# Patient Record
Sex: Female | Born: 1980 | Race: Black or African American | Hispanic: No | Marital: Single | Smoking: Current every day smoker
Health system: Southern US, Community
[De-identification: ages and names within clinical notes are randomized; demographics above are authoritative.]

---

## 2004-10-03 HISTORY — PX: CHOLECYSTECTOMY: SHX55

## 2005-04-11 ENCOUNTER — Emergency Department (HOSPITAL_COMMUNITY): Admission: EM | Admit: 2005-04-11 | Discharge: 2005-04-11 | Payer: Self-pay | Admitting: Emergency Medicine

## 2005-04-28 ENCOUNTER — Emergency Department (HOSPITAL_COMMUNITY): Admission: EM | Admit: 2005-04-28 | Discharge: 2005-04-28 | Payer: Self-pay | Admitting: Emergency Medicine

## 2005-05-14 ENCOUNTER — Inpatient Hospital Stay (HOSPITAL_COMMUNITY): Admission: EM | Admit: 2005-05-14 | Discharge: 2005-05-14 | Payer: Self-pay | Admitting: Emergency Medicine

## 2006-01-02 IMAGING — RF DG CHOLANGIOGRAM OPERATIVE
1 series · 8 of 8 positions shown · non-contrast
Comparison: none

CLINICAL DATA: 23-year-old with acute cholecystitis. 
INTRAOPERATIVE CHOLANGIOGRAM:
Intraoperative cholangiogram was performed by Dr. Rosaynie. 
Two runs are submitted.  These demonstrates contrast within a cystic duct remnant, common hepatic, and common bile duct.  The ducts appear to be normal in caliber.  No filling defects are identified.  There is flow into the duodenum.  There is little opacification of the intrahepatic ducts.

[Series 1: run · 2 acquisitions, 8 frames shown]
[im 1/2]
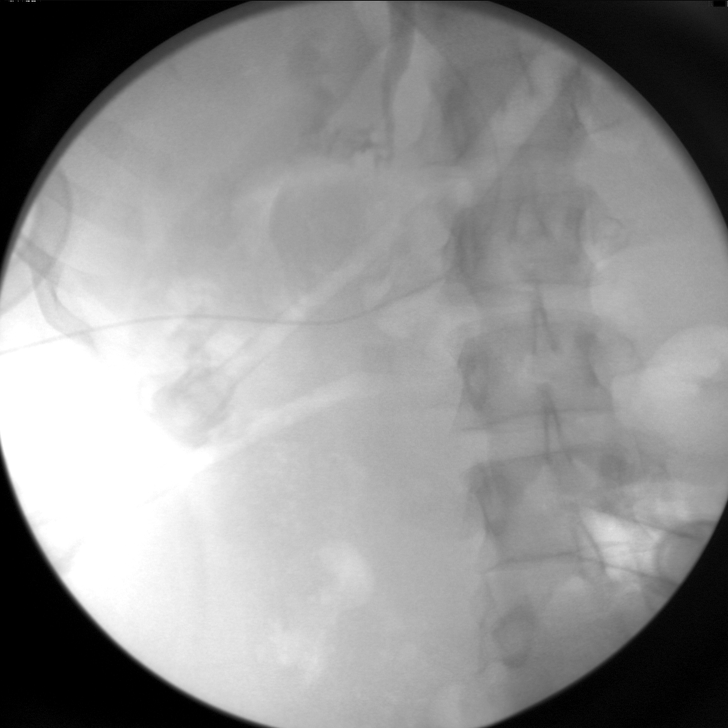
[im 1/2]
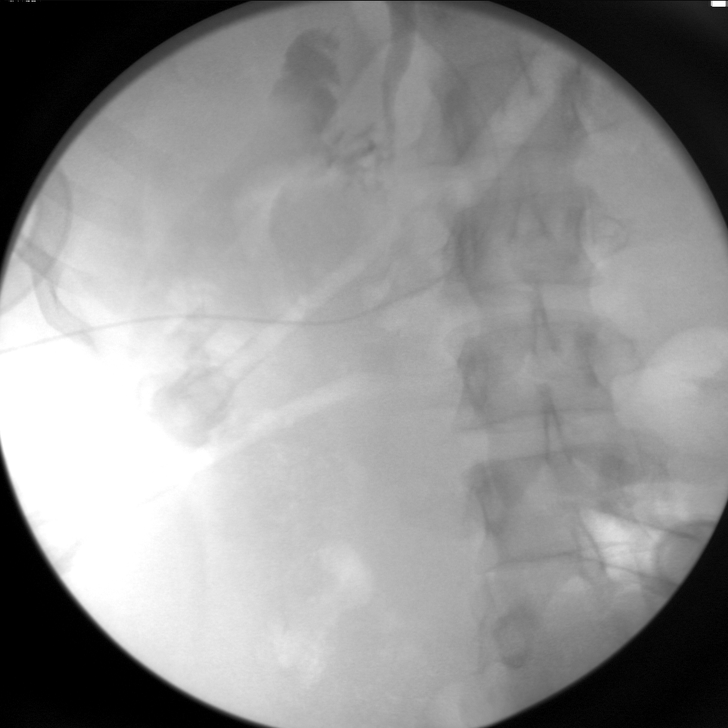
[im 1/2]
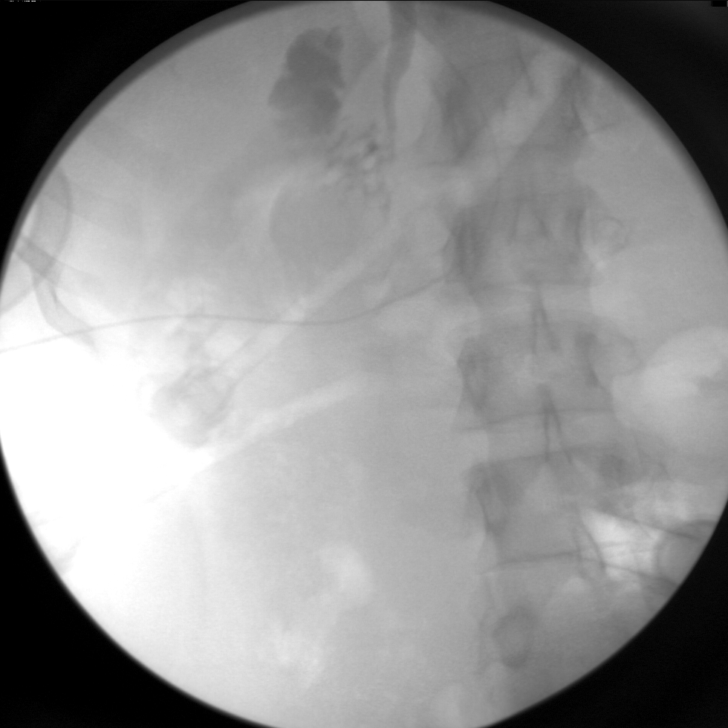
[im 1/2]
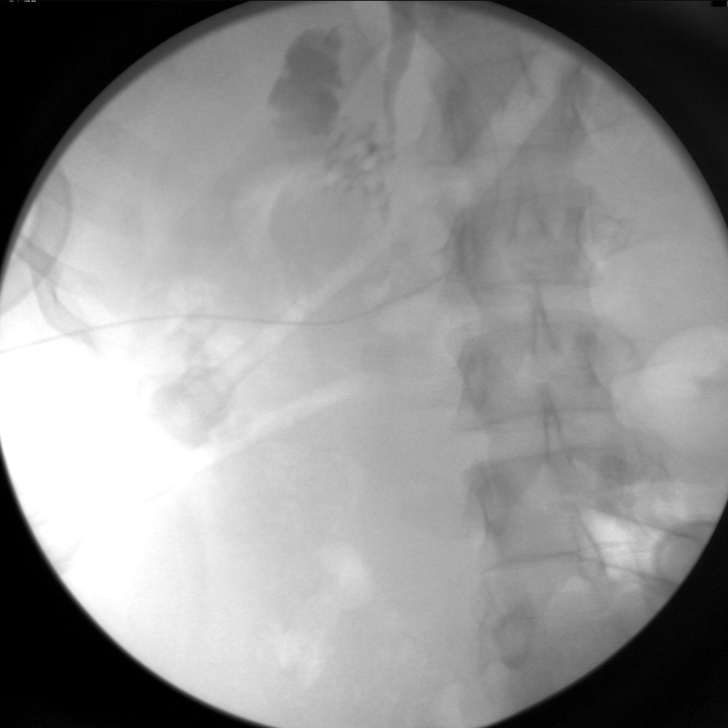
[im 2/2]
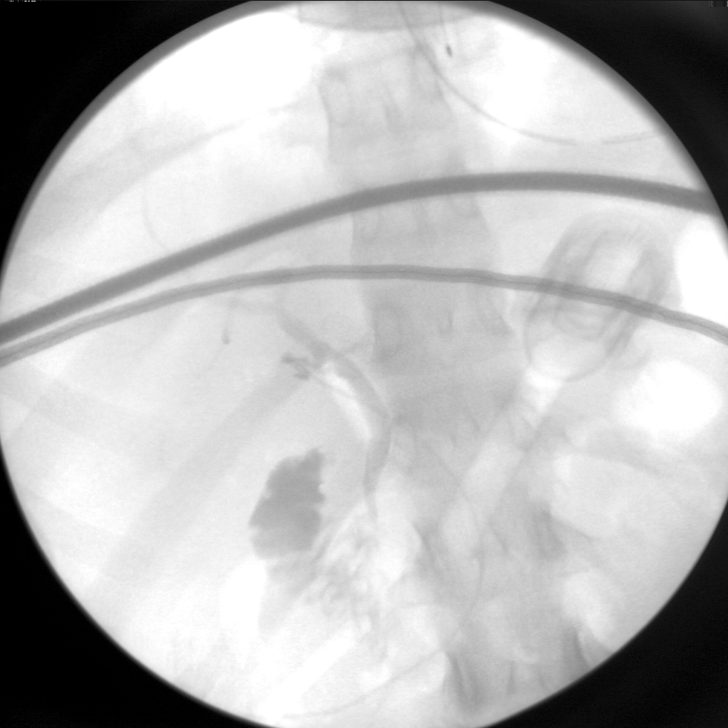
[im 2/2]
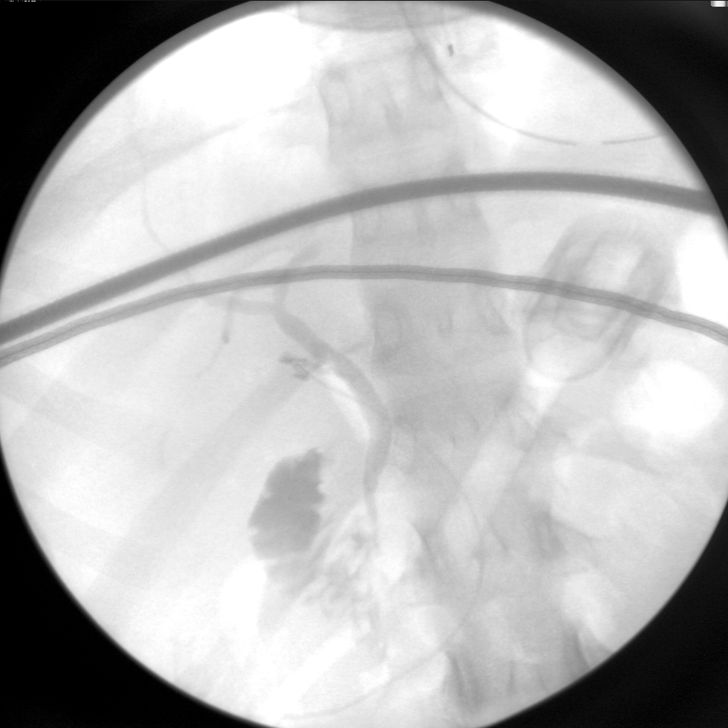
[im 2/2]
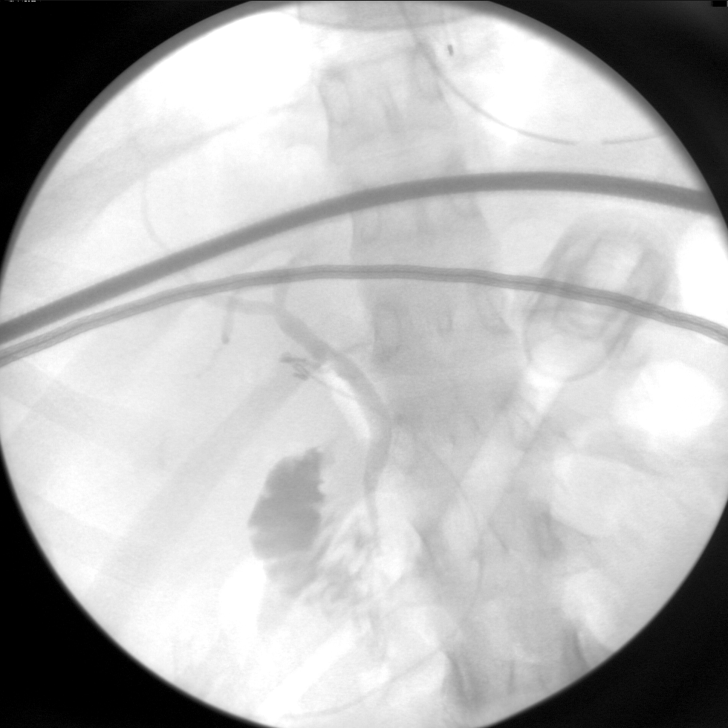
[im 2/2]
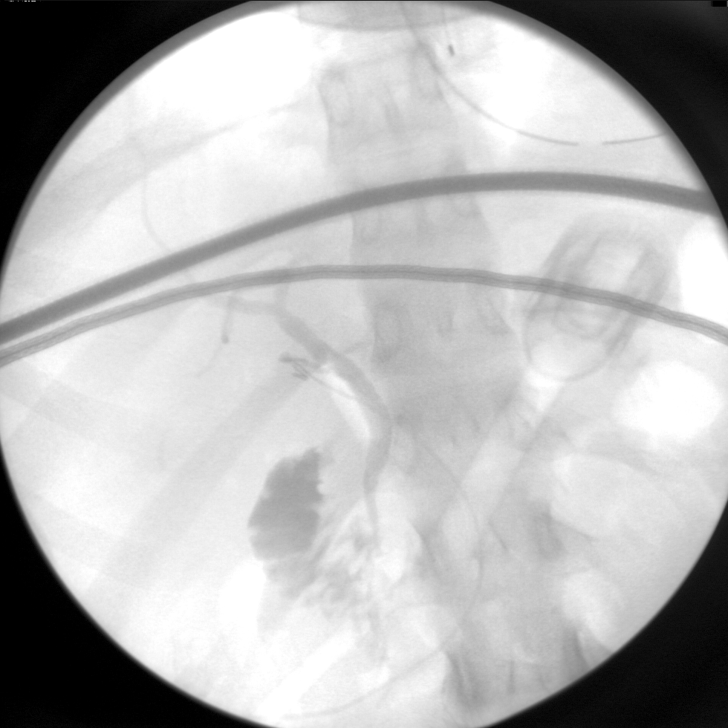

[8 of 8 positions shown; findings below may reference images not displayed]

IMPRESSION: No evidence for retained stones or stricture.

## 2007-10-04 HISTORY — PX: ECTOPIC PREGNANCY SURGERY: SHX613

## 2011-02-10 ENCOUNTER — Other Ambulatory Visit (HOSPITAL_COMMUNITY): Payer: Self-pay | Admitting: Obstetrics and Gynecology

## 2011-02-10 DIAGNOSIS — Z3689 Encounter for other specified antenatal screening: Secondary | ICD-10-CM

## 2011-02-15 ENCOUNTER — Ambulatory Visit (HOSPITAL_COMMUNITY)
Admission: RE | Admit: 2011-02-15 | Discharge: 2011-02-15 | Disposition: A | Discharge status: Home / Self Care | Payer: Medicaid Other | Source: Ambulatory Visit | Attending: Obstetrics and Gynecology | Admitting: Obstetrics and Gynecology

## 2011-02-15 DIAGNOSIS — E669 Obesity, unspecified: Secondary | ICD-10-CM | POA: Insufficient documentation

## 2011-02-15 DIAGNOSIS — O99330 Smoking (tobacco) complicating pregnancy, unspecified trimester: Secondary | ICD-10-CM | POA: Insufficient documentation

## 2011-02-15 DIAGNOSIS — O358XX0 Maternal care for other (suspected) fetal abnormality and damage, not applicable or unspecified: Secondary | ICD-10-CM | POA: Insufficient documentation

## 2011-02-15 DIAGNOSIS — Z363 Encounter for antenatal screening for malformations: Secondary | ICD-10-CM | POA: Insufficient documentation

## 2011-02-15 DIAGNOSIS — O99810 Abnormal glucose complicating pregnancy: Secondary | ICD-10-CM | POA: Insufficient documentation

## 2011-02-15 DIAGNOSIS — Z1389 Encounter for screening for other disorder: Secondary | ICD-10-CM | POA: Insufficient documentation

## 2011-02-15 DIAGNOSIS — Z3689 Encounter for other specified antenatal screening: Secondary | ICD-10-CM

## 2011-04-21 ENCOUNTER — Encounter (HOSPITAL_COMMUNITY)
Admission: RE | Admit: 2011-04-21 | Discharge: 2011-04-21 | Disposition: A | Discharge status: Home / Self Care | Payer: Medicaid Other | Source: Ambulatory Visit | Attending: Obstetrics & Gynecology | Admitting: Obstetrics & Gynecology

## 2011-04-21 ENCOUNTER — Encounter (HOSPITAL_COMMUNITY): Payer: Self-pay

## 2011-04-21 LAB — CBC
HCT: 38.4 % (ref 36.0–46.0)
Platelets: 255 10*3/uL (ref 150–400)
RDW: 15.4 % (ref 11.5–15.5)
WBC: 9 10*3/uL (ref 4.0–10.5)

## 2011-04-21 LAB — SURGICAL PCR SCREEN
MRSA, PCR: NEGATIVE
Staphylococcus aureus: NEGATIVE

## 2011-04-21 LAB — RPR: RPR Ser Ql: NONREACTIVE

## 2011-04-21 NOTE — Patient Instructions (Addendum)
20 [name]  04/21/2011   Your procedure is scheduled on:  Monday 7/23  Report to [facility] at 1045 AM.  Call this number if you have problems the morning of surgery: [phone]   Remember:   Do not eat food:After Midnight.  Do not drink clear liquids: After Midnight.  Take these medicines the morning of surgery with A SIP OF WATER: none                Take 1/2 of usual bedtime dose of insulin the night before surgery.  Do NOT take any insulin the morning of surgery.   Do not wear jewelry, make-up or nail polish.  Do not bring valuables to the hospital.  Contacts, dentures or bridgework may not be worn into surgery.  Leave suitcase in the car. After surgery it may be brought to your room.  For patients admitted to the hospital, checkout time is 11:00 AM the day of discharge.   Patients discharged the day of surgery will not be allowed to drive home.  Name and phone number of your driver: Velna Hatchet - mother - [phone]   Special Instructions: morning of surgery - go to front desk, call [phone] tell them your name and you are here for surgery

## 2011-04-22 ENCOUNTER — Encounter (HOSPITAL_COMMUNITY): Payer: Self-pay | Admitting: *Deleted

## 2011-04-22 ENCOUNTER — Other Ambulatory Visit (HOSPITAL_COMMUNITY): Payer: Medicaid Other

## 2011-04-24 ENCOUNTER — Other Ambulatory Visit: Payer: Self-pay | Admitting: Obstetrics & Gynecology

## 2011-04-24 DIAGNOSIS — Z98891 History of uterine scar from previous surgery: Secondary | ICD-10-CM | POA: Insufficient documentation

## 2011-04-24 DIAGNOSIS — O24414 Gestational diabetes mellitus in pregnancy, insulin controlled: Secondary | ICD-10-CM

## 2011-04-24 NOTE — Progress Notes (Signed)
  29 y.o. [id number]  EDD 7/28, admitted for repeat C/S and tubal sterilization . 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 Prenatal labs: Blood Type:O+; HIV and RPR non-reactive; Hep B neg; Rubella Immune; GBS neg; Fetal screens negative.    Afebrile, VSS Heart and Lungs: No active disease Abdomen: soft, gravid, EFW AGA.  Impression: 39+ wk. pregnancy complicated by previous C/S and insulin dependent GDM.  Pt. desires tubal sterilization.  Plan:  Repeat cesarean, tubal sterilization

## 2011-04-25 ENCOUNTER — Encounter (HOSPITAL_COMMUNITY): Payer: Self-pay | Admitting: Anesthesiology

## 2011-04-25 ENCOUNTER — Inpatient Hospital Stay (HOSPITAL_COMMUNITY)
Admission: RE | Admit: 2011-04-25 | Discharge: 2011-04-27 | DRG: 766 | Disposition: A | Discharge status: Home / Self Care | Payer: Medicaid Other | Source: Ambulatory Visit | Attending: Obstetrics & Gynecology | Admitting: Obstetrics & Gynecology

## 2011-04-25 ENCOUNTER — Inpatient Hospital Stay (HOSPITAL_COMMUNITY): Payer: Medicaid Other | Admitting: Anesthesiology

## 2011-04-25 ENCOUNTER — Encounter (HOSPITAL_COMMUNITY)
Admission: RE | Disposition: A | Discharge status: Home / Self Care | Payer: Self-pay | Source: Ambulatory Visit | Attending: Obstetrics & Gynecology

## 2011-04-25 DIAGNOSIS — Z01812 Encounter for preprocedural laboratory examination: Secondary | ICD-10-CM

## 2011-04-25 DIAGNOSIS — Z302 Encounter for sterilization: Secondary | ICD-10-CM

## 2011-04-25 DIAGNOSIS — O99814 Abnormal glucose complicating childbirth: Secondary | ICD-10-CM | POA: Diagnosis present

## 2011-04-25 DIAGNOSIS — O24414 Gestational diabetes mellitus in pregnancy, insulin controlled: Secondary | ICD-10-CM

## 2011-04-25 DIAGNOSIS — O34219 Maternal care for unspecified type scar from previous cesarean delivery: Principal | ICD-10-CM | POA: Diagnosis present

## 2011-04-25 DIAGNOSIS — Z01818 Encounter for other preprocedural examination: Secondary | ICD-10-CM

## 2011-04-25 DIAGNOSIS — Z98891 History of uterine scar from previous surgery: Secondary | ICD-10-CM

## 2011-04-25 LAB — GLUCOSE, CAPILLARY
Glucose-Capillary: 80 mg/dL (ref 70–99)
Glucose-Capillary: 71 mg/dL (ref 70–99)

## 2011-04-25 LAB — ABO/RH: ABO/RH(D): O POS

## 2011-04-25 SURGERY — Surgical Case
Anesthesia: Spinal | Site: Abdomen | Wound class: Clean Contaminated

## 2011-04-25 MED ORDER — BUPIVACAINE IN DEXTROSE 0.75-8.25 % IT SOLN
INTRATHECAL | Status: DC | PRN
  Administered 2011-04-25: 13.5 mg via INTRATHECAL

## 2011-04-25 MED ORDER — LACTATED RINGERS IV SOLN
INTRAVENOUS | Status: DC | PRN
  Administered 2011-04-25 (×3): via INTRAVENOUS

## 2011-04-25 MED ORDER — SIMETHICONE 80 MG PO CHEW
80.0000 mg | CHEWABLE_TABLET | Freq: Three times a day (TID) | ORAL | Status: DC
  Administered 2011-04-25 – 2011-04-27 (×5): 80 mg via ORAL

## 2011-04-25 MED ORDER — KETOROLAC TROMETHAMINE 30 MG/ML IJ SOLN
30.0000 mg | Freq: Four times a day (QID) | INTRAMUSCULAR | Status: AC | PRN
  Filled 2011-04-25: qty 1

## 2011-04-25 MED ORDER — MEPERIDINE HCL 25 MG/ML IJ SOLN: 6.2500 mg | INTRAMUSCULAR | Status: DC | PRN

## 2011-04-25 MED ORDER — MENTHOL 3 MG MT LOZG: 1.0000 | LOZENGE | OROMUCOSAL | Status: DC | PRN

## 2011-04-25 MED ORDER — SIMETHICONE 80 MG PO CHEW: 80.0000 mg | CHEWABLE_TABLET | ORAL | Status: DC | PRN

## 2011-04-25 MED ORDER — SENNOSIDES-DOCUSATE SODIUM 8.6-50 MG PO TABS
1.0000 | ORAL_TABLET | Freq: Every day | ORAL | Status: DC
  Administered 2011-04-25: 2 via ORAL
  Administered 2011-04-26: 1 via ORAL

## 2011-04-25 MED ORDER — ONDANSETRON HCL 4 MG/2ML IJ SOLN: 4.0000 mg | Freq: Three times a day (TID) | INTRAMUSCULAR | Status: DC | PRN

## 2011-04-25 MED ORDER — METOCLOPRAMIDE HCL 5 MG/ML IJ SOLN
10.0000 mg | Freq: Three times a day (TID) | INTRAMUSCULAR | Status: DC | PRN
  Administered 2011-04-25: 10 mg via INTRAVENOUS

## 2011-04-25 MED ORDER — TETANUS-DIPHTH-ACELL PERTUSSIS 5-2.5-18.5 LF-MCG/0.5 IM SUSP
0.5000 mL | Freq: Once | INTRAMUSCULAR | Status: AC
  Administered 2011-04-26: 0.5 mL via INTRAMUSCULAR
  Filled 2011-04-25: qty 0.5

## 2011-04-25 MED ORDER — LACTATED RINGERS IV SOLN
INTRAVENOUS | Status: DC
  Administered 2011-04-25 (×2): via INTRAVENOUS

## 2011-04-25 MED ORDER — LACTATED RINGERS IV SOLN
INTRAVENOUS | Status: DC
  Administered 2011-04-25: 18:00:00 via INTRAVENOUS

## 2011-04-25 MED ORDER — OXYTOCIN 20 UNITS IN LACTATED RINGERS INFUSION - SIMPLE: 125.0000 mL/h | INTRAVENOUS | Status: DC

## 2011-04-25 MED ORDER — DIPHENHYDRAMINE HCL 25 MG PO CAPS
25.0000 mg | ORAL_CAPSULE | ORAL | Status: DC | PRN
  Administered 2011-04-25 – 2011-04-26 (×4): 25 mg via ORAL
  Filled 2011-04-25 (×3): qty 1

## 2011-04-25 MED ORDER — DIPHENHYDRAMINE HCL 50 MG/ML IJ SOLN
INTRAMUSCULAR | Status: AC
  Administered 2011-04-25: 25 mg via INTRAMUSCULAR
  Filled 2011-04-25: qty 1

## 2011-04-25 MED ORDER — METOCLOPRAMIDE HCL 5 MG/ML IJ SOLN
INTRAMUSCULAR | Status: AC
  Administered 2011-04-25: 10 mg via INTRAVENOUS
  Filled 2011-04-25: qty 2

## 2011-04-25 MED ORDER — CEFAZOLIN SODIUM-DEXTROSE 2-3 GM-% IV SOLR
2.0000 g | INTRAVENOUS | Status: AC
  Administered 2011-04-25: 2 g via INTRAVENOUS
  Filled 2011-04-25: qty 50

## 2011-04-25 MED ORDER — KETOROLAC TROMETHAMINE 60 MG/2ML IM SOLN
INTRAMUSCULAR | Status: AC
  Administered 2011-04-25: 60 mg via INTRAMUSCULAR
  Filled 2011-04-25: qty 2

## 2011-04-25 MED ORDER — KETOROLAC TROMETHAMINE 60 MG/2ML IM SOLN
60.0000 mg | Freq: Once | INTRAMUSCULAR | Status: AC | PRN
  Administered 2011-04-25: 60 mg via INTRAMUSCULAR

## 2011-04-25 MED ORDER — MORPHINE SULFATE (PF) 0.5 MG/ML IJ SOLN
INTRAMUSCULAR | Status: DC | PRN
  Administered 2011-04-25: .15 mg via INTRATHECAL

## 2011-04-25 MED ORDER — SCOPOLAMINE 1 MG/3DAYS TD PT72
MEDICATED_PATCH | TRANSDERMAL | Status: AC
  Administered 2011-04-25: 1.5 mg
  Filled 2011-04-25: qty 1

## 2011-04-25 MED ORDER — FENTANYL CITRATE 0.05 MG/ML IJ SOLN
INTRAMUSCULAR | Status: DC | PRN
  Administered 2011-04-25: 20 ug via INTRATHECAL

## 2011-04-25 MED ORDER — OXYCODONE-ACETAMINOPHEN 5-325 MG PO TABS
1.0000 | ORAL_TABLET | ORAL | Status: DC | PRN
  Administered 2011-04-26 (×2): 1 via ORAL
  Administered 2011-04-26 (×3): 2 via ORAL
  Administered 2011-04-27 (×2): 1 via ORAL
  Filled 2011-04-25: qty 1
  Filled 2011-04-25: qty 2
  Filled 2011-04-25 (×4): qty 1
  Filled 2011-04-25: qty 2
  Filled 2011-04-25: qty 1

## 2011-04-25 MED ORDER — OXYTOCIN 20 UNITS IN LACTATED RINGERS INFUSION - SIMPLE
INTRAVENOUS | Status: DC | PRN
  Administered 2011-04-25 (×2): 20 [IU] via INTRAVENOUS

## 2011-04-25 MED ORDER — NALBUPHINE SYRINGE 5 MG/0.5 ML
5.0000 mg | INJECTION | INTRAMUSCULAR | Status: AC | PRN
  Filled 2011-04-25: qty 1

## 2011-04-25 MED ORDER — EPHEDRINE SULFATE 50 MG/ML IJ SOLN
INTRAMUSCULAR | Status: DC | PRN
  Administered 2011-04-25 (×2): 10 mg via INTRAVENOUS

## 2011-04-25 MED ORDER — DIPHENHYDRAMINE HCL 50 MG/ML IJ SOLN: 12.5000 mg | INTRAMUSCULAR | Status: DC | PRN

## 2011-04-25 MED ORDER — ONDANSETRON HCL 4 MG/2ML IJ SOLN
INTRAMUSCULAR | Status: AC
  Filled 2011-04-25: qty 2

## 2011-04-25 MED ORDER — SODIUM CHLORIDE 0.9 % IR SOLN
Status: DC | PRN
  Administered 2011-04-25: 1000 mL

## 2011-04-25 MED ORDER — KETOROLAC TROMETHAMINE 30 MG/ML IJ SOLN
30.0000 mg | Freq: Four times a day (QID) | INTRAMUSCULAR | Status: AC | PRN
  Administered 2011-04-25: 30 mg via INTRAVENOUS

## 2011-04-25 MED ORDER — IBUPROFEN 600 MG PO TABS
600.0000 mg | ORAL_TABLET | Freq: Four times a day (QID) | ORAL | Status: DC | PRN
  Administered 2011-04-26 – 2011-04-27 (×2): 600 mg via ORAL
  Filled 2011-04-25 (×2): qty 1

## 2011-04-25 MED ORDER — ZOLPIDEM TARTRATE 5 MG PO TABS: 5.0000 mg | ORAL_TABLET | Freq: Every evening | ORAL | Status: DC | PRN

## 2011-04-25 MED ORDER — PRENATAL PLUS 27-1 MG PO TABS
1.0000 | ORAL_TABLET | Freq: Every day | ORAL | Status: DC
  Administered 2011-04-26 – 2011-04-27 (×2): 1 via ORAL
  Filled 2011-04-25 (×2): qty 1

## 2011-04-25 MED ORDER — NALOXONE HCL 0.4 MG/ML IJ SOLN: 0.4000 mg | INTRAMUSCULAR | Status: DC | PRN

## 2011-04-25 MED ORDER — PHENYLEPHRINE 40 MCG/ML (10ML) SYRINGE FOR IV PUSH (FOR BLOOD PRESSURE SUPPORT)
PREFILLED_SYRINGE | INTRAVENOUS | Status: AC
  Filled 2011-04-25: qty 5

## 2011-04-25 MED ORDER — DIPHENHYDRAMINE HCL 50 MG/ML IJ SOLN
25.0000 mg | INTRAMUSCULAR | Status: DC | PRN
Start: 2011-04-25 — End: 2011-04-27
  Administered 2011-04-25: 25 mg via INTRAMUSCULAR

## 2011-04-25 MED ORDER — OXYTOCIN 10 UNIT/ML IJ SOLN
INTRAMUSCULAR | Status: AC
  Filled 2011-04-25: qty 4

## 2011-04-25 MED ORDER — MORPHINE SULFATE 0.5 MG/ML IJ SOLN
INTRAMUSCULAR | Status: AC
  Filled 2011-04-25: qty 10

## 2011-04-25 MED ORDER — IBUPROFEN 600 MG PO TABS
600.0000 mg | ORAL_TABLET | Freq: Four times a day (QID) | ORAL | Status: DC
  Administered 2011-04-26 – 2011-04-27 (×5): 600 mg via ORAL
  Filled 2011-04-25 (×5): qty 1

## 2011-04-25 MED ORDER — SODIUM CHLORIDE 0.9 % IJ SOLN: 3.0000 mL | INTRAMUSCULAR | Status: DC | PRN

## 2011-04-25 MED ORDER — PHENYLEPHRINE HCL 10 MG/ML IJ SOLN
INTRAMUSCULAR | Status: DC | PRN
  Administered 2011-04-25 (×2): 80 ug via INTRAVENOUS

## 2011-04-25 MED ORDER — ONDANSETRON HCL 4 MG/2ML IJ SOLN: 4.0000 mg | INTRAMUSCULAR | Status: DC | PRN

## 2011-04-25 MED ORDER — WITCH HAZEL-GLYCERIN EX PADS: MEDICATED_PAD | CUTANEOUS | Status: DC | PRN

## 2011-04-25 MED ORDER — EPHEDRINE 5 MG/ML INJ
INTRAVENOUS | Status: AC
  Filled 2011-04-25: qty 10

## 2011-04-25 MED ORDER — DIPHENHYDRAMINE HCL 25 MG PO CAPS
25.0000 mg | ORAL_CAPSULE | Freq: Four times a day (QID) | ORAL | Status: DC | PRN
  Administered 2011-04-26: 25 mg via ORAL
  Filled 2011-04-25 (×3): qty 1

## 2011-04-25 MED ORDER — FENTANYL CITRATE 0.05 MG/ML IJ SOLN
INTRAMUSCULAR | Status: AC
  Filled 2011-04-25: qty 2

## 2011-04-25 MED ORDER — SCOPOLAMINE 1 MG/3DAYS TD PT72: 1.0000 | MEDICATED_PATCH | Freq: Once | TRANSDERMAL | Status: DC

## 2011-04-25 MED ORDER — SODIUM CHLORIDE 0.9 % IV SOLN
1.0000 ug/kg/h | INTRAVENOUS | Status: DC | PRN
  Filled 2011-04-25: qty 2.5

## 2011-04-25 MED ORDER — ONDANSETRON HCL 4 MG PO TABS: 4.0000 mg | ORAL_TABLET | ORAL | Status: DC | PRN

## 2011-04-25 MED ORDER — ONDANSETRON HCL 4 MG/2ML IJ SOLN
INTRAMUSCULAR | Status: DC | PRN
  Administered 2011-04-25: 4 mg via INTRAVENOUS

## 2011-04-25 SURGICAL SUPPLY — 32 items
CLIP FILSHIE TUBAL LIGA STRL (Clip) ×1 IMPLANT
CLOTH BEACON ORANGE TIMEOUT ST (SAFETY) ×2 IMPLANT
CONTAINER PREFILL 10% NBF 15ML (MISCELLANEOUS) IMPLANT
DRAPE UTILITY XL STRL (DRAPES) ×2 IMPLANT
ELECT REM PT RETURN 9FT ADLT (ELECTROSURGICAL) ×2
ELECTRODE REM PT RTRN 9FT ADLT (ELECTROSURGICAL) ×1 IMPLANT
EXTRACTOR VACUUM M CUP 4 TUBE (SUCTIONS) ×1 IMPLANT
GLOVE ECLIPSE 6.0 STRL STRAW (GLOVE) ×2 IMPLANT
GLOVE ECLIPSE 6.5 STRL STRAW (GLOVE) ×2 IMPLANT
GOWN BRE IMP SLV AUR LG STRL (GOWN DISPOSABLE) ×6 IMPLANT
KIT ABG SYR 3ML LUER SLIP (SYRINGE) IMPLANT
NDL HYPO 25X5/8 SAFETYGLIDE (NEEDLE) ×1 IMPLANT
NEEDLE HYPO 25X5/8 SAFETYGLIDE (NEEDLE) ×2 IMPLANT
NS IRRIG 1000ML POUR BTL (IV SOLUTION) ×2 IMPLANT
PACK C SECTION WH (CUSTOM PROCEDURE TRAY) ×2 IMPLANT
RTRCTR C-SECT PINK 25CM LRG (MISCELLANEOUS) ×1 IMPLANT
SLEEVE SCD COMPRESS KNEE MED (MISCELLANEOUS) IMPLANT
STAPLER VISISTAT 35W (STAPLE) ×1 IMPLANT
SUT PLAIN 0 NONE (SUTURE) ×1 IMPLANT
SUT VIC AB 0 CT1 27 (SUTURE) ×6
SUT VIC AB 0 CT1 27XBRD ANBCTR (SUTURE) ×3 IMPLANT
SUT VIC AB 1 CTX 36 (SUTURE) ×4
SUT VIC AB 1 CTX36XBRD ANBCTRL (SUTURE) ×2 IMPLANT
SUT VIC AB 3-0 CT1 27 (SUTURE) ×2
SUT VIC AB 3-0 CT1 TAPERPNT 27 (SUTURE) ×1 IMPLANT
SUT VIC AB 3-0 PS2 18 (SUTURE)
SUT VIC AB 3-0 PS2 18XBRD (SUTURE) IMPLANT
SUT VIC AB 3-0 SH 27 (SUTURE) ×2
SUT VIC AB 3-0 SH 27X BRD (SUTURE) IMPLANT
TOWEL OR 17X24 6PK STRL BLUE (TOWEL DISPOSABLE) ×4 IMPLANT
TRAY FOLEY CATH 14FR (SET/KITS/TRAYS/PACK) ×2 IMPLANT
WATER STERILE IRR 1000ML POUR (IV SOLUTION) ×2 IMPLANT

## 2011-04-25 NOTE — Progress Notes (Signed)
  Patient Name: TJUANA VICKREY MRN: [id number]  Date of Surgery: 04/25/2011    PREOPERATIVE DIAGNOSIS: prev cesarean section ;desire sterilization; insulin dependent GDM  POSTOPERATIVE DIAGNOSIS: prev cesarean section ;desire sterilization; insulin dependent GDM   PROCEDURE: Repeat C/S with tubal sterilization  SURGEON: Caralyn Guile. Arlyce Dice M.D.  ASSISTANT: Lodema Hong, M.D.  ANESTHESIA: Spinal  ESTIMATED BLOOD LOSS: 800 ml  FINDINGS: Female, 6 lbs. 8 ozs., Apgars 9/9, clear fluid, nl. tubes and ovaries   INDICATIONS: This is a 30 y.o.  [place] [address] who is admitted for prev c/s;desire sterilization.  PROCEDURE IN DETAIL: The patient was taken to the operating room and spinal anesthesia was placed.  She was then placed in the supine position with left lateral displacement of the uterus. The abdomen was prepped and draped in a sterile fashion and the bladder was catheterized.  A low transverse abdominal incision was made and carried down to the fascia. The fascia was opened transversely and the rectus sheath was dissected from the underlying rectus muscle. The rectus midline was identified and opened by sharp and blunt dissection. The peritoneum was opened. Omental adhesions were lysed.  An Alexis retractor was placed and the lower segment was identified. An transverse incision was made in the lower segment, carried down to the amnion, and extended bluntly.  The infant was delivered without difficulty with the aid of a vacuum extractor. The placenta was delivered with uterine message and cord traction. The uterus was bluntly curettage. The lower segment was closed with running interlocking Vicryl 1 suture.  The fallopian tubes were identified bilaterally, elevated with Babcock clamps and occluded at the isthmic-ampulary junction with Filshie clips.  The peritoneum and rectus muscle were closed in the midline with running 3-0 Vicryl suture. The fascia was closed with running 0 Vicryl suture and  the skin was closed with staples. All sponge and instrument counts were correct.  The patient tolerated the procedure well and left the operating room in good condition.

## 2011-04-25 NOTE — Anesthesia Postprocedure Evaluation (Signed)
  Anesthesia Post-op Note  Patient: Meghan Hughes  Procedure(s) Performed:  CESAREAN SECTION WITH BILATERAL TUBAL LIGATION - with Filshie Clips No anesthesia complications.  Level of consciousness: alert. Cardiopulmonary status stable.  No follow-up care or observation required.  Paquita Printy L. Rodman Pickle, MD

## 2011-04-25 NOTE — Anesthesia Preprocedure Evaluation (Addendum)
Anesthesia Evaluation  Name, MR# and DOB Patient awake  General Assessment Comment  Reviewed: Allergy & Precautions, H&P  and Patient's Chart, lab work & pertinent test results  Airway Mallampati: II TM Distance: >3 FB Neck ROM: full    Dental No notable dental hx    Pulmonary  clear to auscultation  pulmonary exam normal   Cardiovascular Exercise Tolerance: Good regular Normal   Neuro/Psych  GI/Hepatic/Renal   Endo/Other   (+) Diabetes mellitus-, Gestational Morbid obesity Abdominal   Musculoskeletal  Hematology   Peds  Reproductive/Obstetrics   Anesthesia Other Findings             Anesthesia Physical Anesthesia Plan  ASA: III  Anesthesia Plan: Spinal   Post-op Pain Management:    Induction:   Airway Management Planned:   Additional Equipment:   Intra-op Plan:   Post-operative Plan:   Informed Consent: I have reviewed the patients History and Physical, chart, labs and discussed the procedure including the risks, benefits and alternatives for the proposed anesthesia with the patient or authorized representative who has indicated his/her understanding and acceptance.   Dental Advisory Given  Plan Discussed with: CRNA  Anesthesia Plan Comments: (I discussed spinal anesthetic, and patient consents to the procedure:  included risk of possible headache,backache, failed block, allergic reaction, and nerve injury. This patient was asked if she had any questions or concerns before the procedure started. )        Anesthesia Quick Evaluation

## 2011-04-25 NOTE — Progress Notes (Signed)
UR chart review completed.  

## 2011-04-25 NOTE — H&P (Signed)
  29 y.o. G3P1011  EDD 7/28, admitted for repeat C/S and tubal sterilization . 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 Prenatal labs: Blood Type:O+; HIV and RPR non-reactive; Hep B neg; Rubella Immune; GBS neg; Fetal screens negative.    Afebrile, VSS Heart and Lungs: No active disease Abdomen: soft, gravid, EFW AGA.  Impression: 39+ wk. pregnancy complicated by previous C/S and insulin dependent GDM.  Pt. desires tubal sterilization.  Plan:  Repeat cesarean, tubal sterilization    

## 2011-04-25 NOTE — Consult Note (Signed)
Delivery Note   04/25/2011  1:03 PM  Requested by Dr.  Arlyce Dice  to attend this repeat C-section.    Born to a 30 y/o G3P1 mother with PNC O+Ab- and negative screens.    Prenatal problems included  GDM- Insulin controlled, obesity and  (+) smoker.       AROM at delivery with clear fluid.    Infant delivered via vacuum - assisted C-section.   Handed to Neo crying.  Dried, bulb suctioned and kept warm.  APGAR 9 and 9.  Shown to parents and transferred to CN.  Care transfer to Peds. Teaching service.    Meghan Abrahams V.T. [name], MD Neonatologist

## 2011-04-25 NOTE — Transfer of Care (Signed)
Immediate Anesthesia Transfer of Care Note  Patient: Meghan Hughes  Procedure(s) Performed:  CESAREAN SECTION WITH BILATERAL TUBAL LIGATION - with Filshie Clips   Patient Location: PACU  Anesthesia Type: Spinal  Level of Consciousness: awake, alert , oriented and patient cooperative  Airway & Oxygen Therapy: Patient Spontanous Breathing  Post-op Assessment: Report given to PACU RN and Post -op Vital signs reviewed and stable  Post vital signs: Reviewed and stable  Complications: No apparent anesthesia complications

## 2011-04-25 NOTE — Anesthesia Procedure Notes (Addendum)
Spinal Block  Patient location during procedure: OR Start time: 04/25/2011 12:46 PM Staffing Anesthesiologist: Jiles Garter Preanesthetic Checklist Completed: patient identified, site marked, surgical consent, pre-op evaluation, timeout performed, IV checked, risks and benefits discussed and monitors and equipment checked Spinal Block Patient position: sitting Prep: DuraPrep Patient monitoring: heart rate, cardiac monitor, continuous pulse ox and blood pressure Approach: midline Location: L3-4 Injection technique: single-shot Needle Needle type: Sprotte  Needle gauge: 24 G Needle length: 9 cm Assessment Sensory level: T4 Additional Notes Spinal Dosage in OR  Bupivicaine ml       1.8 PFMS04   mcg        150 Fentanyl mcg            20

## 2011-04-25 NOTE — Progress Notes (Deleted)
  30 y.o. G3P1011  EDD 7/28, admitted for repeat C/S and tubal sterilization . 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 Prenatal labs: Blood Type:O+; HIV and RPR non-reactive; Hep B neg; Rubella Immune; GBS neg; Fetal screens negative.    Afebrile, VSS Heart and Lungs: No active disease Abdomen: soft, gravid, EFW AGA.  Impression: 39+ wk. pregnancy complicated by previous C/S and insulin dependent GDM.  Pt. desires tubal sterilization.  Plan:  Repeat cesarean, tubal sterilization    

## 2011-04-25 NOTE — Brief Op Note (Signed)
04/25/2011  3:46 PM  PATIENT:  Oswaldo Milian  30 y.o. female  PRE-OPERATIVE DIAGNOSIS:  prev cesarean section ;desire sterilization  POST-OPERATIVE DIAGNOSIS:  prev cesarean section ;desire sterilization  PROCEDURE:  Procedure(s): CESAREAN SECTION WITH BILATERAL TUBAL LIGATION  SURGEON:  Surgeon(s): Hetal Proano D Chong Sicilian  PHYSICIAN ASSISTANT:   ASSISTANTSGreta Doom   ANESTHESIA:   none  ESTIMATED BLOOD LOSS: * No blood loss amount entered *   BLOOD ADMINISTERED:none  DRAINS: none   LOCAL MEDICATIONS USED:  NONE  SPECIMEN:  No Specimen  DISPOSITION OF SPECIMEN:  N/A  COUNTS:  YES  TOURNIQUET:  * No tourniquets in log *  DICTATION #: done   PLAN OF CARE: Routine  PATIENT DISPOSITION:  PACU - hemodynamically stable.   Delay start of Pharmacological VTE agent (>24hrs) due to surgical blood loss or risk of bleeding:  not applicable

## 2011-04-26 LAB — GLUCOSE, CAPILLARY: Glucose-Capillary: 73 mg/dL (ref 70–99)

## 2011-04-26 LAB — CBC
Hemoglobin: 11.1 g/dL — ABNORMAL LOW (ref 12.0–15.0)
MCH: 28.5 pg (ref 26.0–34.0)
MCHC: 32.7 g/dL (ref 30.0–36.0)
Platelets: 246 10*3/uL (ref 150–400)

## 2011-04-26 NOTE — Op Note (Signed)
Mickel Baas Physician Signed Progress Notes 04/25/2011 1:34 PM   Patient Name: Meghan Hughes  MRN: [id number]  Date of Surgery: 04/25/2011  PREOPERATIVE DIAGNOSIS: prev cesarean section ;desire sterilization; insulin dependent GDM  POSTOPERATIVE DIAGNOSIS: prev cesarean section ;desire sterilization; insulin dependent GDM  PROCEDURE: Repeat C/S with tubal sterilization  SURGEON: Caralyn Guile. Arlyce Dice M.D.  ASSISTANT: Lodema Hong, M.D.  ANESTHESIA: Spinal  ESTIMATED BLOOD LOSS: 800 ml  FINDINGS: Female, 6 lbs. 8 ozs., Apgars 9/9, clear fluid, nl. tubes and ovaries  INDICATIONS: This is a 30 y.o. [place] [address] who is admitted for prev c/s;desire sterilization.  PROCEDURE IN DETAIL: The patient was taken to the operating room and spinal anesthesia was placed. She was then placed in the supine position with left lateral displacement of the uterus. The abdomen was prepped and draped in a sterile fashion and the bladder was catheterized. A low transverse abdominal incision was made and carried down to the fascia. The fascia was opened transversely and the rectus sheath was dissected from the underlying rectus muscle. The rectus midline was identified and opened by sharp and blunt dissection. The peritoneum was opened. Omental adhesions were lysed. An Alexis retractor was placed and the lower segment was identified. An transverse incision was made in the lower segment, carried down to the amnion, and extended bluntly. The infant was delivered without difficulty with the aid of a vacuum extractor. The placenta was delivered with uterine message and cord traction. The uterus was bluntly curettage. The lower segment was closed with running interlocking Vicryl 1 suture. The fallopian tubes were identified bilaterally, elevated with Babcock clamps and occluded at the isthmic-ampulary junction with Filshie clips. The peritoneum and rectus muscle were closed in the midline with running 3-0 Vicryl suture. The fascia  was closed with running 0 Vicryl suture and the skin was closed with staples. All sponge and instrument counts were correct. The patient tolerated the procedure well and left the operating room in good condition

## 2011-04-26 NOTE — Progress Notes (Signed)
Mom having to pump and dump due to positive Marijuana in baby UDS per Pediatrician. Lactation brochure reviewed with mom, community resources for breastfeeding moms discussed. Advised of outpatient services if needed. Pump and storage guidelines reviewed. Mom reports advised by Peds to pump and dump for 1 week. Discussed importance of not using ETOH, street drugs with breastfeeding.

## 2011-04-26 NOTE — Progress Notes (Signed)
Pt doing well.  FBS this am - 78. Normal lochia, normal bowel function. VSSAF Imp/ doing well Plan/ routine care.

## 2011-04-27 LAB — RUBELLA ANTIBODY, IGM: Rubella: IMMUNE

## 2011-04-27 LAB — GLUCOSE, CAPILLARY
Glucose-Capillary: 84 mg/dL (ref 70–99)
Glucose-Capillary: 85 mg/dL (ref 70–99)

## 2011-04-27 LAB — RPR: RPR: NONREACTIVE

## 2011-04-27 NOTE — Discharge Summary (Signed)
  Meghan Hughes has checked her postprandial sugar after breakfast and lunch and both have been entirely normal. She is having normal bowel function, bladder function, vital signs were stable, pain is well controlled, incision is clear dry, she is bottlefeeding without difficulty, and she is desirous of discharge-early.  Discharge diagnoses-  #1 term pregnancy delivered 6 lbs. 8 oz. Female infant Apgars 9 and 9  #2 blood type O positive  #3 insulin requiring gestational diabetes  #4 previous cesarean section-desires for repeat  #5 desire for permanent elective sterilization  Procedures-  Repeat low transverse cesarean section and tubal sterilization procedure-04/25/11  This patient, a gravida 3 now para 2 aborta 1 female who was followed during her pregnancy in our office and placed on insulin to manage her gestational diabetes. She did well and was scheduled for repeat cesarean section at term which was carried out by Dr. Arlyce Dice without difficulty on 7/23. She also had a tubal sterilization procedure at that time.  On the morning of 7/25 she was desirous of discharge. She was stable and doing quite well and it was determined after checking several postprandial sugars that she would not require additional medications for sugar control postpartum-at least this point in time.  Her wound was clean and dry her staples were removed and her wound Steri-Stripped without difficulty.  She was given all appropriate instructions for discharge brochure and understood all structures well. She will call the office to arrange for circumcision for her baby and, as well, will sever postpartum visit with Dr. Arlyce Dice approximately 334 weeks' time.  Her discharge hemoglobin was 11.1, white count 9800, and platelet count 246,000.  Discharge medications include-vitamins daily until she finishes her current supply. She was also given prescriptions for Motrin 600 mg to use every 6 hours as needed for cramping or pain and  Percocet 5/325 to use 1-2 every 4-6 hours as needed for more severe pain.  Condition on discharge improved

## 2011-04-27 NOTE — Progress Notes (Signed)
Stable this AM and desirous of early discharge.  Rpt C/S and Tubal 7/23.  Bottle feeding and instructed.  CBC 7/24: 11.1/9.8 and Platelets 246K.  Had only one sugar -- a fasting AM 7/24.  Was on insulin while pregnant for GDM. Will check several post prandial sugars today and consider early D/C this PM.  Circ to be arranged in office.  Wound clean and dry.  Normal bowel and bladder function.  Will reasess this PM for Early D/C.

## 2011-06-17 ENCOUNTER — Encounter (HOSPITAL_COMMUNITY): Payer: Self-pay | Admitting: *Deleted

## 2014-08-04 ENCOUNTER — Encounter (HOSPITAL_COMMUNITY): Payer: Self-pay | Admitting: *Deleted

## 2014-08-19 ENCOUNTER — Encounter (HOSPITAL_COMMUNITY): Payer: Self-pay | Admitting: *Deleted

## 2014-08-19 ENCOUNTER — Emergency Department (HOSPITAL_COMMUNITY)
Admission: EM | Admit: 2014-08-19 | Discharge: 2014-08-19 | Disposition: A | Discharge status: Home / Self Care | Payer: Medicaid Other | Attending: Emergency Medicine | Admitting: Emergency Medicine

## 2014-08-19 DIAGNOSIS — Z720 Tobacco use: Secondary | ICD-10-CM | POA: Diagnosis not present

## 2014-08-19 DIAGNOSIS — J029 Acute pharyngitis, unspecified: Secondary | ICD-10-CM | POA: Diagnosis not present

## 2014-08-19 DIAGNOSIS — Z7951 Long term (current) use of inhaled steroids: Secondary | ICD-10-CM | POA: Diagnosis not present

## 2014-08-19 DIAGNOSIS — Z79899 Other long term (current) drug therapy: Secondary | ICD-10-CM | POA: Insufficient documentation

## 2014-08-19 DIAGNOSIS — E119 Type 2 diabetes mellitus without complications: Secondary | ICD-10-CM | POA: Diagnosis not present

## 2014-08-19 DIAGNOSIS — R0981 Nasal congestion: Secondary | ICD-10-CM

## 2014-08-19 LAB — RAPID STREP SCREEN (MED CTR MEBANE ONLY): STREPTOCOCCUS, GROUP A SCREEN (DIRECT): NEGATIVE

## 2014-08-19 MED ORDER — FLUTICASONE PROPIONATE 50 MCG/ACT NA SUSP
2.0000 | Freq: Every day | NASAL | Status: DC
Start: 2014-08-19 — End: 2018-03-12

## 2014-08-19 MED ORDER — GUAIFENESIN ER 600 MG PO TB12: 600.0000 mg | ORAL_TABLET | Freq: Two times a day (BID) | ORAL | Status: DC

## 2014-08-19 NOTE — ED Notes (Signed)
Pt reports last week she had to stand out in the rain for 1 hour to wait for child to get off bus, since Wednesday sore throat. Pain 10/10.

## 2014-08-19 NOTE — Discharge Instructions (Signed)
Use nasal spray as directed. Take mucinex as prescribed. Rest and stay well hydrated.  Sore Throat A sore throat is pain, burning, irritation, or scratchiness of the throat. There is often pain or tenderness when swallowing or talking. A sore throat may be accompanied by other symptoms, such as coughing, sneezing, fever, and swollen neck glands. A sore throat is often the first sign of another sickness, such as a cold, flu, strep throat, or mononucleosis (commonly known as mono). Most sore throats go away without medical treatment. CAUSES  The most common causes of a sore throat include:  A viral infection, such as a cold, flu, or mono.  A bacterial infection, such as strep throat, tonsillitis, or whooping cough.  Seasonal allergies.  Dryness in the air.  Irritants, such as smoke or pollution.  Gastroesophageal reflux disease (GERD). HOME CARE INSTRUCTIONS   Only take over-the-counter medicines as directed by your caregiver.  Drink enough fluids to keep your urine clear or pale yellow.  Rest as needed.  Try using throat sprays, lozenges, or sucking on hard candy to ease any pain (if older than 4 years or as directed).  Sip warm liquids, such as broth, herbal tea, or warm water with honey to relieve pain temporarily. You may also eat or drink cold or frozen liquids such as frozen ice pops.  Gargle with salt water (mix 1 tsp salt with 8 oz of water).  Do not smoke and avoid secondhand smoke.  Put a cool-mist humidifier in your bedroom at night to moisten the air. You can also turn on a hot shower and sit in the bathroom with the door closed for 5-10 minutes. SEEK IMMEDIATE MEDICAL CARE IF:  You have difficulty breathing.  You are unable to swallow fluids, soft foods, or your saliva.  You have increased swelling in the throat.  Your sore throat does not get better in 7 days.  You have nausea and vomiting.  You have a fever or persistent symptoms for more than 2-3  days.  You have a fever and your symptoms suddenly get worse. MAKE SURE YOU:   Understand these instructions.  Will watch your condition.  Will get help right away if you are not doing well or get worse. Document Released: 10/27/2004 Document Revised: 09/05/2012 Document Reviewed: 05/27/2012 [facility] CenterExit[facility] Patient Information 2015 [place], [place]. This information is not intended to replace advice given to you by your health care provider. Make sure you discuss any questions you have with your health care provider.  Salt Water Gargle This solution will help make your mouth and throat feel better. HOME CARE INSTRUCTIONS   Mix 1 teaspoon of salt in 8 ounces of warm water.  Gargle with this solution as much or often as you need or as directed. Swish and gargle gently if you have any sores or wounds in your mouth.  Do not swallow this mixture. Document Released: 06/23/2004 Document Revised: 12/12/2011 Document Reviewed: 11/14/2008 [facility]ExitCare Patient Information 2015 [place], [place]. This information is not intended to replace advice given to you by your health care provider. Make sure you discuss any questions you have with your health care provider. Sinusitis Sinusitis is redness, soreness, and inflammation of the paranasal sinuses. Paranasal sinuses are air pockets within the bones of your face (beneath the eyes, the middle of the forehead, or above the eyes). In healthy paranasal sinuses, mucus is able to drain out, and air is able to circulate through them by way of your nose. However, when your paranasal  sinuses are inflamed, mucus and air can become trapped. This can allow bacteria and other germs to grow and cause infection. Sinusitis can develop quickly and last only a short time (acute) or continue over a long period (chronic). Sinusitis that lasts for more than 12 weeks is considered chronic.  CAUSES  Causes of sinusitis include:  Allergies.  Structural abnormalities, such as  displacement of the cartilage that separates your nostrils (deviated septum), which can decrease the air flow through your nose and sinuses and affect sinus drainage.  Functional abnormalities, such as when the small hairs (cilia) that line your sinuses and help remove mucus do not work properly or are not present. SIGNS AND SYMPTOMS  Symptoms of acute and chronic sinusitis are the same. The primary symptoms are pain and pressure around the affected sinuses. Other symptoms include:  Upper toothache.  Earache.  Headache.  Bad breath.  Decreased sense of smell and taste.  A cough, which worsens when you are lying flat.  Fatigue.  Fever.  Thick drainage from your nose, which often is green and may contain pus (purulent).  Swelling and warmth over the affected sinuses. DIAGNOSIS  Your health care provider will perform a physical exam. During the exam, your health care provider may:  Look in your nose for signs of abnormal growths in your nostrils (nasal polyps).  Tap over the affected sinus to check for signs of infection.  View the inside of your sinuses (endoscopy) using an imaging device that has a light attached (endoscope). If your health care provider suspects that you have chronic sinusitis, one or more of the following tests may be recommended:  Allergy tests.  Nasal culture. A sample of mucus is taken from your nose, sent to a lab, and screened for bacteria.  Nasal cytology. A sample of mucus is taken from your nose and examined by your health care provider to determine if your sinusitis is related to an allergy. TREATMENT  Most cases of acute sinusitis are related to a viral infection and will resolve on their own within 10 days. Sometimes medicines are prescribed to help relieve symptoms (pain medicine, decongestants, nasal steroid sprays, or saline sprays).  However, for sinusitis related to a bacterial infection, your health care provider will prescribe antibiotic  medicines. These are medicines that will help kill the bacteria causing the infection.  Rarely, sinusitis is caused by a fungal infection. In theses cases, your health care provider will prescribe antifungal medicine. For some cases of chronic sinusitis, surgery is needed. Generally, these are cases in which sinusitis recurs more than 3 times per year, despite other treatments. HOME CARE INSTRUCTIONS   Drink plenty of water. Water helps thin the mucus so your sinuses can drain more easily.  Use a humidifier.  Inhale steam 3 to 4 times a day (for example, sit in the bathroom with the shower running).  Apply a warm, moist washcloth to your face 3 to 4 times a day, or as directed by your health care provider.  Use saline nasal sprays to help moisten and clean your sinuses.  Take medicines only as directed by your health care provider.  If you were prescribed either an antibiotic or antifungal medicine, finish it all even if you start to feel better. SEEK IMMEDIATE MEDICAL CARE IF:  You have increasing pain or severe headaches.  You have nausea, vomiting, or drowsiness.  You have swelling around your face.  You have vision problems.  You have a stiff neck.  You  have difficulty breathing. MAKE SURE YOU:   Understand these instructions.  Will watch your condition.  Will get help right away if you are not doing well or get worse. Document Released: 09/19/2005 Document Revised: 02/03/2014 Document Reviewed: 10/04/2011 [facility]Exit[facility] Patient Information 2015 [place], [place]. This information is not intended to replace advice given to you by your health care provider. Make sure you discuss any questions you have with your health care provider.

## 2014-08-19 NOTE — ED Provider Notes (Signed)
CSN: [id number]     Arrival date & time 08/19/14  1817 History  This chart was scribed for non-physician practitioner working with Toy CookeyMegan Docherty, MD by Richarda Overlieichard Holland, ED Scribe. This patient was seen in room WTR9/WTR9 and the patient's care was started at 7:31 PM.    Chief Complaint  Patient presents with  . Sore Throat   The history is provided by the patient. No language interpreter was used.   HPI Comments: Meghan Hughes is a 33 y.o. female who presents to the Emergency Department complaining of a sore throat for the last 6 days. Pt states she had to stand out in the rain for 1 hour to wait for her child to get off the bus last week. She reports a maximum temperature of 101.3 five days ago. She reports an associated cough and mild congestion. She states she has been taking Alka Seltzer plus that has provided mild relief to her symptoms. She denies any sick contacts. Pt reports a history of smoking but states she has been smoking less this past week.   Past Medical History  Diagnosis Date  . Diabetes mellitus 2012    gestational/insulin   Past Surgical History  Procedure Laterality Date  . Ectopic pregnancy surgery  2009  . Cesarean section  2011  . Cholecystectomy  2006   History reviewed. No pertinent family history. History  Substance Use Topics  . Smoking status: Current Every Day Smoker -- 15 years    Types: Cigarettes  . Smokeless tobacco: Not on file  . Alcohol Use: No   OB History    Gravida Para Term Preterm AB TAB SAB Ectopic Multiple Living   3 2 1  1   1  2      Review of Systems  Constitutional: Positive for fever.  HENT: Positive for congestion, sore throat and trouble swallowing.   Respiratory: Positive for cough.     Allergies  Review of patient's allergies indicates no known allergies.  Home Medications   Prior to Admission medications   Medication Sig Start Date End Date Taking? Authorizing Provider  Aspirin-Salicylamide-Caffeine (BC HEADACHE  PO) Take 1 packet by mouth 2 (two) times daily as needed (cold symptoms).   Yes Historical Provider, MD  Phenyleph-Doxylamine-DM-APAP (ALKA SELTZER PLUS PO) Take 1 tablet by mouth daily as needed (cold symptoms).   Yes Historical Provider, MD  fluticasone (FLONASE) 50 MCG/ACT nasal spray Place 2 sprays into both nostrils daily. 08/19/14   Neeta Storey M Flonnie Wierman, PA-C  guaiFENesin (MUCINEX) 600 MG 12 hr tablet Take 1 tablet (600 mg total) by mouth 2 (two) times daily. 08/19/14   Marquisha Nikolov M Shiree Altemus, PA-C   BP 135/61 mmHg  Pulse 92  Temp(Src) 98.4 F (36.9 C) (Oral)  Resp 16  SpO2 96% Physical Exam  Constitutional: She is oriented to person, place, and time. She appears well-developed and well-nourished. No distress.  HENT:  Head: Normocephalic and atraumatic.  Mouth/Throat: Oropharynx is clear and moist.  Nasal congestin and mucosal edema, post nasal drip. Bilateral frontal sinus tenderness.   Eyes: Conjunctivae and EOM are normal.  Neck: Normal range of motion. Neck supple.  Cardiovascular: Normal rate, regular rhythm and normal heart sounds.   Pulmonary/Chest: Effort normal and breath sounds normal. No respiratory distress.  Musculoskeletal: Normal range of motion. She exhibits no edema.  Neurological: She is alert and oriented to person, place, and time. No sensory deficit.  Skin: Skin is warm and dry.  Psychiatric: She has a normal mood and  affect. Her behavior is normal.  Nursing note and vitals reviewed.   ED Course  Procedures  DIAGNOSTIC STUDIES: Oxygen Saturation is 96% on RA, normal by my interpretation.    COORDINATION OF CARE: 7:34 PM Discussed treatment plan with pt at bedside and pt agreed to plan.   Labs Review Labs Reviewed  RAPID STREP SCREEN  CULTURE, GROUP A STREP    Imaging Review No results found.   EKG Interpretation None      MDM   Final diagnoses:  Sinus congestion  Sore throat   Pt in NAD. AFVSS. Rapid strep obtained prior to pt being seen, negative.  Discussed symptomatic treatment including nasal saline, flonase and mucinex. She is stable for d/c. Return precautions given. Patient states understanding of treatment care plan and is agreeable.   I personally performed the services described in this documentation, which was scribed in my presence. The recorded information has been reviewed and is accurate.  Kathrynn SpeedRobyn M Lillard Bailon, PA-C 08/19/14 1944  Toy CookeyMegan Docherty, MD 08/20/14 [phone]

## 2014-08-21 LAB — CULTURE, GROUP A STREP

## 2017-01-30 ENCOUNTER — Ambulatory Visit (HOSPITAL_COMMUNITY)
Admission: EM | Admit: 2017-01-30 | Discharge: 2017-01-30 | Disposition: A | Discharge status: Home / Self Care | Payer: Medicaid Other | Attending: Internal Medicine | Admitting: Internal Medicine

## 2017-01-30 ENCOUNTER — Encounter (HOSPITAL_COMMUNITY): Payer: Self-pay | Admitting: Emergency Medicine

## 2017-01-30 DIAGNOSIS — R220 Localized swelling, mass and lump, head: Secondary | ICD-10-CM | POA: Diagnosis not present

## 2017-01-30 DIAGNOSIS — L03211 Cellulitis of face: Secondary | ICD-10-CM

## 2017-01-30 MED ORDER — SULFAMETHOXAZOLE-TRIMETHOPRIM 800-160 MG PO TABS: 1.0000 | ORAL_TABLET | Freq: Two times a day (BID) | ORAL | 0 refills | Status: AC

## 2017-01-30 NOTE — ED Provider Notes (Signed)
CSN: [id number]     Arrival date & time 01/30/17  1149 History   None    Chief Complaint  Patient presents with  . Facial Swelling   (Consider location/radiation/quality/duration/timing/severity/associated sxs/prior Treatment) Pt complains of swelling to her right eyelid.  Pt reports area began yesterday.  Pt has been soaking and swelling has gone down some.  Pt reports area is painful.  Pt thinks area is infected. No fever. No chills   The history is provided by the patient. No language interpreter was used.    Past Medical History:  Diagnosis Date  . Diabetes mellitus 2012   gestational/insulin   Past Surgical History:  Procedure Laterality Date  . CESAREAN SECTION  2011  . CHOLECYSTECTOMY  2006  . ECTOPIC PREGNANCY SURGERY  2009   No family history on file. Social History  Substance Use Topics  . Smoking status: Current Every Day Smoker    Years: 15.00    Types: Cigarettes  . Smokeless tobacco: Not on file  . Alcohol use No   OB History    Gravida Para Term Preterm AB Living   SAB TAB Ectopic Multiple Live Births       1   2     Review of Systems  All other systems reviewed and are negative.   Allergies  Patient has no known allergies.  Home Medications   Prior to Admission medications   Medication Sig Start Date End Date Taking? Authorizing Provider  aspirin-sod bicarb-citric acid (ALKA-SELTZER) 325 MG TBEF tablet Take 325 mg by mouth every 6 (six) hours as needed.   Yes Historical Provider, MD  Aspirin-Salicylamide-Caffeine (BC HEADACHE PO) Take 1 packet by mouth 2 (two) times daily as needed (cold symptoms).    Historical Provider, MD  fluticasone (FLONASE) 50 MCG/ACT nasal spray Place 2 sprays into both nostrils daily. 08/19/14   Robyn M Hess, PA-C  guaiFENesin (MUCINEX) 600 MG 12 hr tablet Take 1 tablet (600 mg total) by mouth 2 (two) times daily. 08/19/14   Robyn M Hess, PA-C  Phenyleph-Doxylamine-DM-APAP (ALKA SELTZER PLUS PO) Take 1  tablet by mouth daily as needed (cold symptoms).    Historical Provider, MD  sulfamethoxazole-trimethoprim (BACTRIM DS,SEPTRA DS) 800-160 MG tablet Take 1 tablet by mouth 2 (two) times daily. 01/30/17 02/06/17  Elson Areas, PA-C   Meds Ordered and Administered this Visit  Medications - No data to display  BP 124/60 (BP Location: Right Arm)   Pulse 78   Temp 97.9 F (36.6 C) (Oral)   Resp 16   LMP 01/08/2017   SpO2 100%  No data found.   Physical Exam  Constitutional: She is oriented to person, place, and time. She appears well-developed and well-nourished.  HENT:  Head: Normocephalic.  Eyes: Conjunctivae and EOM are normal. Pupils are equal, round, and reactive to light.  Neck: Normal range of motion.  Pulmonary/Chest: Effort normal.  Abdominal: She exhibits no distension.  Musculoskeletal: Normal range of motion.  Neurological: She is alert and oriented to person, place, and time.  Psychiatric: She has a normal mood and affect.  Nursing note and vitals reviewed.   Urgent Care Course     Procedures (including critical care time)  Labs Review Labs Reviewed - No data to display  Imaging Review No results found.   Visual Acuity Review  Right Eye Distance:   Left Eye Distance:   Bilateral Distance:    Right Eye Near:  Left Eye Near:    Bilateral Near:       Meds ordered this encounter  Medications  . aspirin-sod bicarb-citric acid (ALKA-SELTZER) 325 MG TBEF tablet    Sig: Take 325 mg by mouth every 6 (six) hours as needed.  . sulfamethoxazole-trimethoprim (BACTRIM DS,SEPTRA DS) 800-160 MG tablet    Sig: Take 1 tablet by mouth 2 (two) times daily.    Dispense:  14 tablet    Refill:  0    Order Specific Question:   Supervising Provider    Answer:   Eustace Moore [[id number]]    MDM   1. Right facial swelling   2. Facial cellulitis    An After Visit Summary was printed and given to the patient.    Lonia Skinner [place], PA-C 01/30/17 1341

## 2017-01-30 NOTE — ED Triage Notes (Signed)
Woke with swelling to right eye brow.  Swelling has improved slightly since this morning.  Patient noticed o bump on Saturday.

## 2017-01-30 NOTE — Discharge Instructions (Signed)
Warm compresses 20 minutes 4-5 times a day

## 2017-06-04 ENCOUNTER — Ambulatory Visit (INDEPENDENT_AMBULATORY_CARE_PROVIDER_SITE_OTHER): Payer: Medicaid Other

## 2017-06-04 ENCOUNTER — Emergency Department (HOSPITAL_COMMUNITY): Payer: Medicaid Other

## 2017-06-04 ENCOUNTER — Ambulatory Visit (HOSPITAL_COMMUNITY)
Admission: EM | Admit: 2017-06-04 | Discharge: 2017-06-04 | Disposition: A | Discharge status: Home / Self Care | Payer: Medicaid Other | Attending: Physician Assistant | Admitting: Physician Assistant

## 2017-06-04 ENCOUNTER — Emergency Department (HOSPITAL_COMMUNITY)
Admission: EM | Admit: 2017-06-04 | Discharge: 2017-06-05 | Disposition: A | Discharge status: Home / Self Care | Payer: Medicaid Other | Attending: Emergency Medicine | Admitting: Emergency Medicine

## 2017-06-04 ENCOUNTER — Encounter (HOSPITAL_COMMUNITY): Payer: Self-pay | Admitting: *Deleted

## 2017-06-04 ENCOUNTER — Encounter (HOSPITAL_COMMUNITY): Payer: Self-pay | Admitting: Emergency Medicine

## 2017-06-04 DIAGNOSIS — M940 Chondrocostal junction syndrome [Tietze]: Secondary | ICD-10-CM

## 2017-06-04 DIAGNOSIS — F17210 Nicotine dependence, cigarettes, uncomplicated: Secondary | ICD-10-CM | POA: Insufficient documentation

## 2017-06-04 DIAGNOSIS — R0789 Other chest pain: Secondary | ICD-10-CM

## 2017-06-04 DIAGNOSIS — Z79899 Other long term (current) drug therapy: Secondary | ICD-10-CM | POA: Insufficient documentation

## 2017-06-04 DIAGNOSIS — R079 Chest pain, unspecified: Secondary | ICD-10-CM | POA: Diagnosis not present

## 2017-06-04 DIAGNOSIS — R071 Chest pain on breathing: Secondary | ICD-10-CM | POA: Diagnosis present

## 2017-06-04 DIAGNOSIS — E119 Type 2 diabetes mellitus without complications: Secondary | ICD-10-CM | POA: Diagnosis not present

## 2017-06-04 LAB — CBC
HCT: 38 % (ref 36.0–46.0)
Hemoglobin: 12.4 g/dL (ref 12.0–15.0)
MCH: 28.2 pg (ref 26.0–34.0)
MCHC: 32.6 g/dL (ref 30.0–36.0)
MCV: 86.6 fL (ref 78.0–100.0)
PLATELETS: 427 10*3/uL — AB (ref 150–400)
RBC: 4.39 MIL/uL (ref 3.87–5.11)
RDW: 14.5 % (ref 11.5–15.5)
WBC: 9.7 10*3/uL (ref 4.0–10.5)

## 2017-06-04 LAB — BASIC METABOLIC PANEL
Anion gap: 10 (ref 5–15)
BUN: 6 mg/dL (ref 6–20)
CALCIUM: 9 mg/dL (ref 8.9–10.3)
CHLORIDE: 104 mmol/L (ref 101–111)
CO2: 22 mmol/L (ref 22–32)
CREATININE: 0.76 mg/dL (ref 0.44–1.00)
GFR calc Af Amer: >60 mL/min (ref >60)
Glucose, Bld: 89 mg/dL (ref 65–99)
POTASSIUM: 3.7 mmol/L (ref 3.5–5.1)
Sodium: 136 mmol/L (ref 135–145)

## 2017-06-04 LAB — I-STAT TROPONIN, ED: TROPONIN I, POC: 0 ng/mL (ref 0.00–0.08)

## 2017-06-04 MED ORDER — MELOXICAM 15 MG PO TABS: 7.5000 mg | ORAL_TABLET | Freq: Every day | ORAL | 0 refills | Status: AC

## 2017-06-04 NOTE — ED Provider Notes (Addendum)
06/04/2017 7:58 PM   DOB: 10/08/1980 / MRN: [id number]  SUBJECTIVE:  Meghan Hughes is a 36 y.o. female with a history of diabetes presenting for chest pain that started yesterday. She is a current smoker. Tells me this was preceded by cough and sneezing from allergies.  No exertional component, SOB.  Feels that she is getting worse.   She has No Known Allergies.   She  has a past medical history of Diabetes mellitus (2012).    She  reports that she has been smoking Cigarettes.  She has smoked for the past 15.00 years. She has never used smokeless tobacco. She reports that she uses drugs, including Marijuana. She reports that she does not drink alcohol. She  reports that she currently engages in sexual activity. She reports using the following method of birth control/protection: None. The patient  has a past surgical history that includes Ectopic pregnancy surgery (2009); Cesarean section (2011); and Cholecystectomy (2006).  Her family history is not on file.  Review of Systems  Constitutional: Negative for chills, diaphoresis and fever.  Respiratory: Negative for shortness of breath.   Cardiovascular: Negative for chest pain, orthopnea and leg swelling.  Gastrointestinal: Negative for abdominal pain, blood in stool, constipation, diarrhea, heartburn, melena, nausea and vomiting.  Genitourinary: Negative for flank pain.  Skin: Negative for rash.  Neurological: Negative for dizziness.    OBJECTIVE:  BP (!) 104/53 (BP Location: Left Arm)   Pulse 97   Temp 98.4 F (36.9 C) (Oral)   Resp 20   LMP 05/20/2017 (Exact Date)   SpO2 100%   Wt Readings from Last 3 Encounters:  04/25/11 225 lb (102.1 kg)  04/21/11 225 lb (102.1 kg)    Temp Readings from Last 3 Encounters:  06/04/17 98.4 F (36.9 C) (Oral)  01/30/17 97.9 F (36.6 C) (Oral)  08/19/14 98.4 F (36.9 C) (Oral)   BP Readings from Last 3 Encounters:  06/04/17 (!) 104/53  01/30/17 124/60  08/19/14 135/61   Pulse Readings  from Last 3 Encounters:  06/04/17 97  01/30/17 78  08/19/14 92     Physical Exam  Constitutional: She is active.  Non-toxic appearance.  Cardiovascular: Normal rate, regular rhythm, normal heart sounds and intact distal pulses.   Pulmonary/Chest: Effort normal. No stridor. No tachypnea. No respiratory distress. She has no wheezes. She has no rales. She exhibits tenderness (right sided, almost exquisite).  Musculoskeletal: Normal range of motion.  Neurological: She is alert.  Skin: Skin is warm and dry. She is not diaphoretic. No pallor.    No results found for this or any previous visit (from the past 72 hour(s)).  Dg Chest 2 View  Result Date: 06/04/2017 CLINICAL DATA:  Shortness of breath and cough. EXAM: CHEST  2 VIEW COMPARISON:  None. FINDINGS: The heart size and mediastinal contours are within normal limits. There is no focal infiltrate, pulmonary edema, or pleural effusion. The visualized skeletal structures are unremarkable. IMPRESSION: No active cardiopulmonary disease. Electronically Signed   By: Sherian ReinWei-Chen  Lin M.D.   On: 06/04/2017 19:41    ASSESSMENT AND PLAN:  The encounter diagnosis was Chest wall pain. EKG and chest rads benign.  She is exquistely tender on exam. Given her history of smoking I think it is best for her to get a rule out of PE, and while I have no hard findings that point in that direction her pain seems out of proportion to just a musculoskeletal etiology.  I have referred her to the ED  and she tells me she will go directly there.     The patient is advised to call or return to clinic if she does not see an improvement in symptoms, or to seek the care of the closest emergency department if she worsens with the above plan.   Deliah Boston, MHS, PA-C 06/04/2017 7:58 PM    Ofilia Neas, PA-C 06/04/17 1958    Ofilia Neas, PA-C 06/04/17 2009

## 2017-06-04 NOTE — ED Triage Notes (Signed)
[facility] with SOB that started today.  Sent here for d-dimer.  Unaware of chest pain until patient arrived to triage room.  EKG delayed because of this.

## 2017-06-04 NOTE — ED Triage Notes (Signed)
Patient reports chest wall pain, increases with movement, coughing, touching, etc starting today. Reports nasal congestion, sob, and cough over the last couple days.

## 2017-06-04 NOTE — ED Provider Notes (Signed)
[facility]-EMERGENCY DEPT Provider Note   CSN: [id number] Arrival date & time: 06/04/17  2126     History   Chief Complaint Chief Complaint  Patient presents with  . Chest Pain  . Cough    HPI Meghan Hughes is a 36 y.o. female.  HPI   36 year old female with history of diabetes, tobacco use sent here from Urgent care for evaluation of chest pain and shortness of breath.patient states for the past several days she has had allergy symptoms with congestion and occasional sneezing. Yesterday she developed a mild tenderness to the right side of the chest but the pain has been increasing in intensity and now has been persistent. Describe pain as a sharp sensation to the right side of the chest straight to her back as if she was shot by an arrow. It hurts when she takes a deep breath or when she push on her chest. She denies any associated fever, chills, hemoptysis, exertional dyspnea, lightheadedness, dizziness, abdominal pain or back pain. No prior history of PE or DVT, no recent surgery, prolonged bed rest, unilateral leg swelling or calf pain, active cancer or hemoptysis. She is a smoker, denies any family history of premature cardiac death. Denies any strenuous activities or heavy lifting.  Past Medical History:  Diagnosis Date  . Diabetes mellitus 2012   gestational/insulin    Patient Active Problem List   Diagnosis Date Noted  . Gestational diabetes mellitus in pregnancy, insulin controlled 04/24/2011  . Previous cesarean section 04/24/2011    Past Surgical History:  Procedure Laterality Date  . CESAREAN SECTION  2011  . CHOLECYSTECTOMY  2006  . ECTOPIC PREGNANCY SURGERY  2009    OB History    Gravida Para Term Preterm AB Living   3 2 1   1 2    SAB TAB Ectopic Multiple Live Births       1   2       Home Medications    Prior to Admission medications   Medication Sig Start Date End Date Taking? Authorizing Provider  Aspirin-Salicylamide-Caffeine (BC HEADACHE PO) Take 1  packet by mouth 2 (two) times daily as needed (cold symptoms).    [provider]  aspirin-sod bicarb-citric acid (ALKA-SELTZER) 325 MG TBEF tablet Take 325 mg by mouth every 6 (six) hours as needed.    [provider]  fluticasone (FLONASE) 50 MCG/ACT nasal spray Place 2 sprays into both nostrils daily. 08/19/14   Hess, Nada Boozer, PA-C  guaiFENesin (MUCINEX) 600 MG 12 hr tablet Take 1 tablet (600 mg total) by mouth 2 (two) times daily. 08/19/14   Hess, Nada Boozer, PA-C  meloxicam (MOBIC) 15 MG tablet Take 0.5-1 tablets (7.5-15 mg total) by mouth daily. Take with food. Do not take Ibuprofen, Goody's, or Aleve while taking this medication. 06/04/17 07/04/17  Ofilia Neas, PA-C  Phenyleph-Doxylamine-DM-APAP (ALKA SELTZER PLUS PO) Take 1 tablet by mouth daily as needed (cold symptoms).    [provider]    Family History No family history on file.  Social History Social History  Substance Use Topics  . Smoking status: Current Every Day Smoker    Years: 15.00    Types: Cigarettes  . Smokeless tobacco: Never Used  . Alcohol use No     Allergies   Patient has no known allergies.   Review of Systems Review of Systems  All other systems reviewed and are negative.    Physical Exam Updated Vital Signs BP (!) 139/99 (BP Location: Right  Arm)   Pulse 91   Temp 98.3 F (36.8 C) (Oral)   Resp 18   Ht 5\' 6"  (1.676 m)   Wt 102.1 kg (225 lb)   LMP 05/20/2017 (Exact Date)   SpO2 100%   BMI 36.32 kg/m   Physical Exam  Constitutional: She appears well-developed and well-nourished. No distress.  Obese female resting company in no acute discomfort  HENT:  Head: Atraumatic.  Eyes: Conjunctivae are normal.  Neck: Neck supple. No JVD present.  Cardiovascular: Normal rate and regular rhythm.   Pulmonary/Chest: Effort normal and breath sounds normal. No respiratory distress. She has no wheezes. She has no rales. She exhibits tenderness (tenderness to right anterior  chest wall on palpation without crepitus or emphysema. No rash).  Abdominal: Soft. She exhibits no distension. There is no tenderness.  Musculoskeletal: She exhibits no edema.  Neurological: She is alert.  Skin: No rash noted.  Psychiatric: She has a normal mood and affect.  Nursing note and vitals reviewed.    ED Treatments / Results  Labs (all labs ordered are listed, but only abnormal results are displayed) Labs Reviewed  CBC - Abnormal; Notable for the following:       Result Value   Platelets 427 (*)    All other components within normal limits  BASIC METABOLIC PANEL  I-STAT TROPONIN, ED    EKG  EKG Interpretation  Date/Time:  Sunday June 04 2017 21:53:21 EDT Ventricular Rate:  93 PR Interval:  156 QRS Duration: 76 QT Interval:  366 QTC Calculation: 455 R Axis:   5 Text Interpretation:  Normal sinus rhythm Cannot rule out Anterior infarct , age undetermined Abnormal ECG Confirmed by Margarita Grizzleay, Danielle [phone]([id number]) on 06/04/2017 11:03:01 PM       Radiology Dg Chest 2 View  Result Date: 06/04/2017 CLINICAL DATA:  Shortness of breath and cough. EXAM: CHEST  2 VIEW COMPARISON:  None. FINDINGS: The heart size and mediastinal contours are within normal limits. There is no focal infiltrate, pulmonary edema, or pleural effusion. The visualized skeletal structures are unremarkable. IMPRESSION: No active cardiopulmonary disease. Electronically Signed   By: Sherian ReinWei-Chen  Lin M.D.   On: 06/04/2017 19:41    Procedures Procedures (including critical care time)  Medications Ordered in ED Medications - No data to display   Initial Impression / Assessment and Plan / ED Course  I have reviewed the triage vital signs and the nursing notes.  Pertinent labs & imaging results that were available during my care of the patient were reviewed by me and considered in my medical decision making (see chart for details).     BP (!) 117/93   Pulse 89   Temp 98.3 F (36.8 C) (Oral)   Resp (!)  23   Ht 5\' 6"  (1.676 m)   Wt 102.1 kg (225 lb)   LMP 05/20/2017 (Exact Date)   SpO2 100%   BMI 36.32 kg/m    Final Clinical Impressions(s) / ED Diagnoses   Final diagnoses:  Costochondritis, acute    New Prescriptions New Prescriptions   IBUPROFEN (ADVIL,MOTRIN) 600 MG TABLET    Take 1 tablet (600 mg total) by mouth every 6 (six) hours as needed.   12:01 AM Patient sent here from urgent care 2 rule out of PE with a d-dimer. She does not have any significant risk factor for PE aside from smoking and obesity. D-dimer ordered. I review her previous testing including a chest x-ray, EKG and labs and all of which are unremarkable.  12:21 AM Normal D-dimer  Pt felt reassured.  She has a f/u appointment with her PCP tomorrow.  Return precaution given.    Fayrene Helper, PA-C 06/05/17 0023    Gilda Crease, MD 06/05/17 [phone]

## 2017-06-04 NOTE — Discharge Instructions (Signed)
Your EKG and chest xray is normaI Despite that, want you to go to the ED to have a D-dimer (due to your smoking history) to rule out a blood clot that could be causing your pain.

## 2017-06-05 LAB — D-DIMER, QUANTITATIVE (NOT AT ARMC): D DIMER QUANT: 0.3 ug{FEU}/mL (ref 0.00–0.50)

## 2017-06-05 MED ORDER — IBUPROFEN 600 MG PO TABS: 600.0000 mg | ORAL_TABLET | Freq: Four times a day (QID) | ORAL | 0 refills | Status: DC | PRN

## 2017-06-05 NOTE — ED Notes (Signed)
Patient Alert and oriented X4. Stable and ambulatory. Patient verbalized understanding of the discharge instructions.  Patient belongings were taken by the patient.  

## 2018-01-23 IMAGING — DX DG CHEST 2V
2 series · 2 of 2 positions shown · non-contrast
Comparison: None.

CLINICAL DATA: Shortness of breath and cough.

EXAM:
CHEST  2 VIEW

[chest pa]
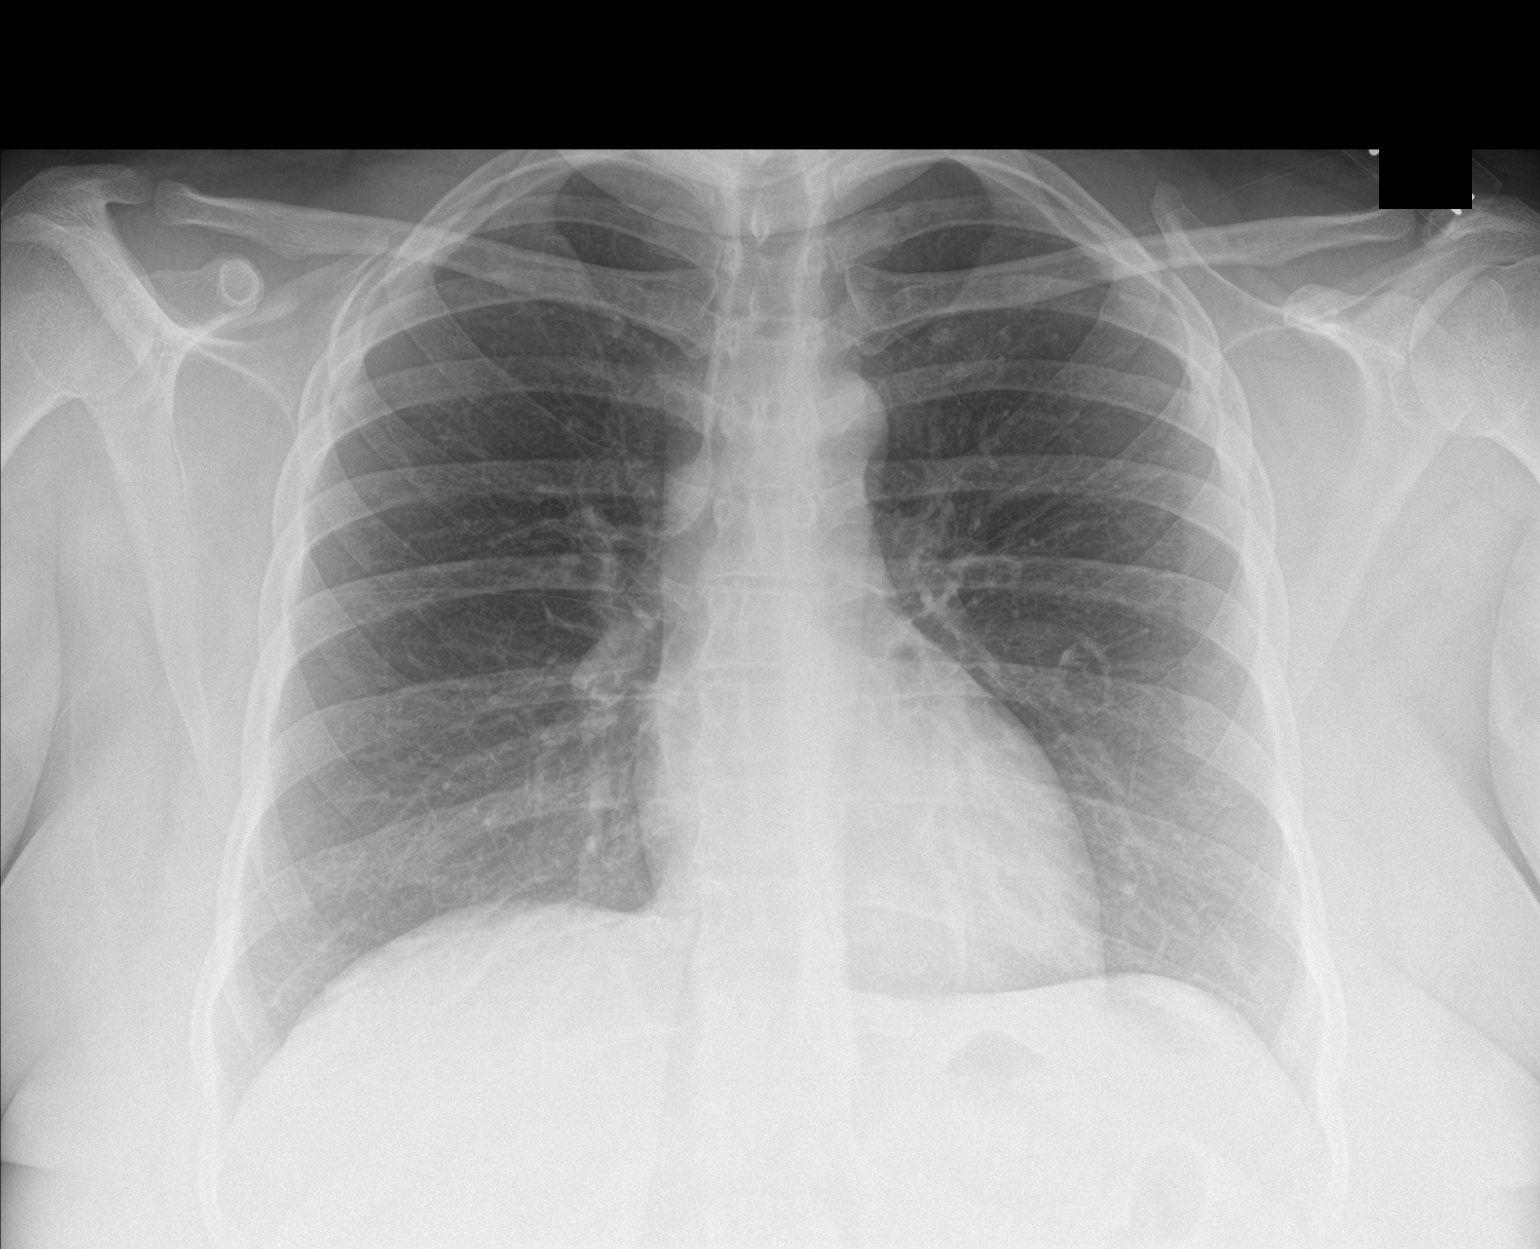

[chest lat]
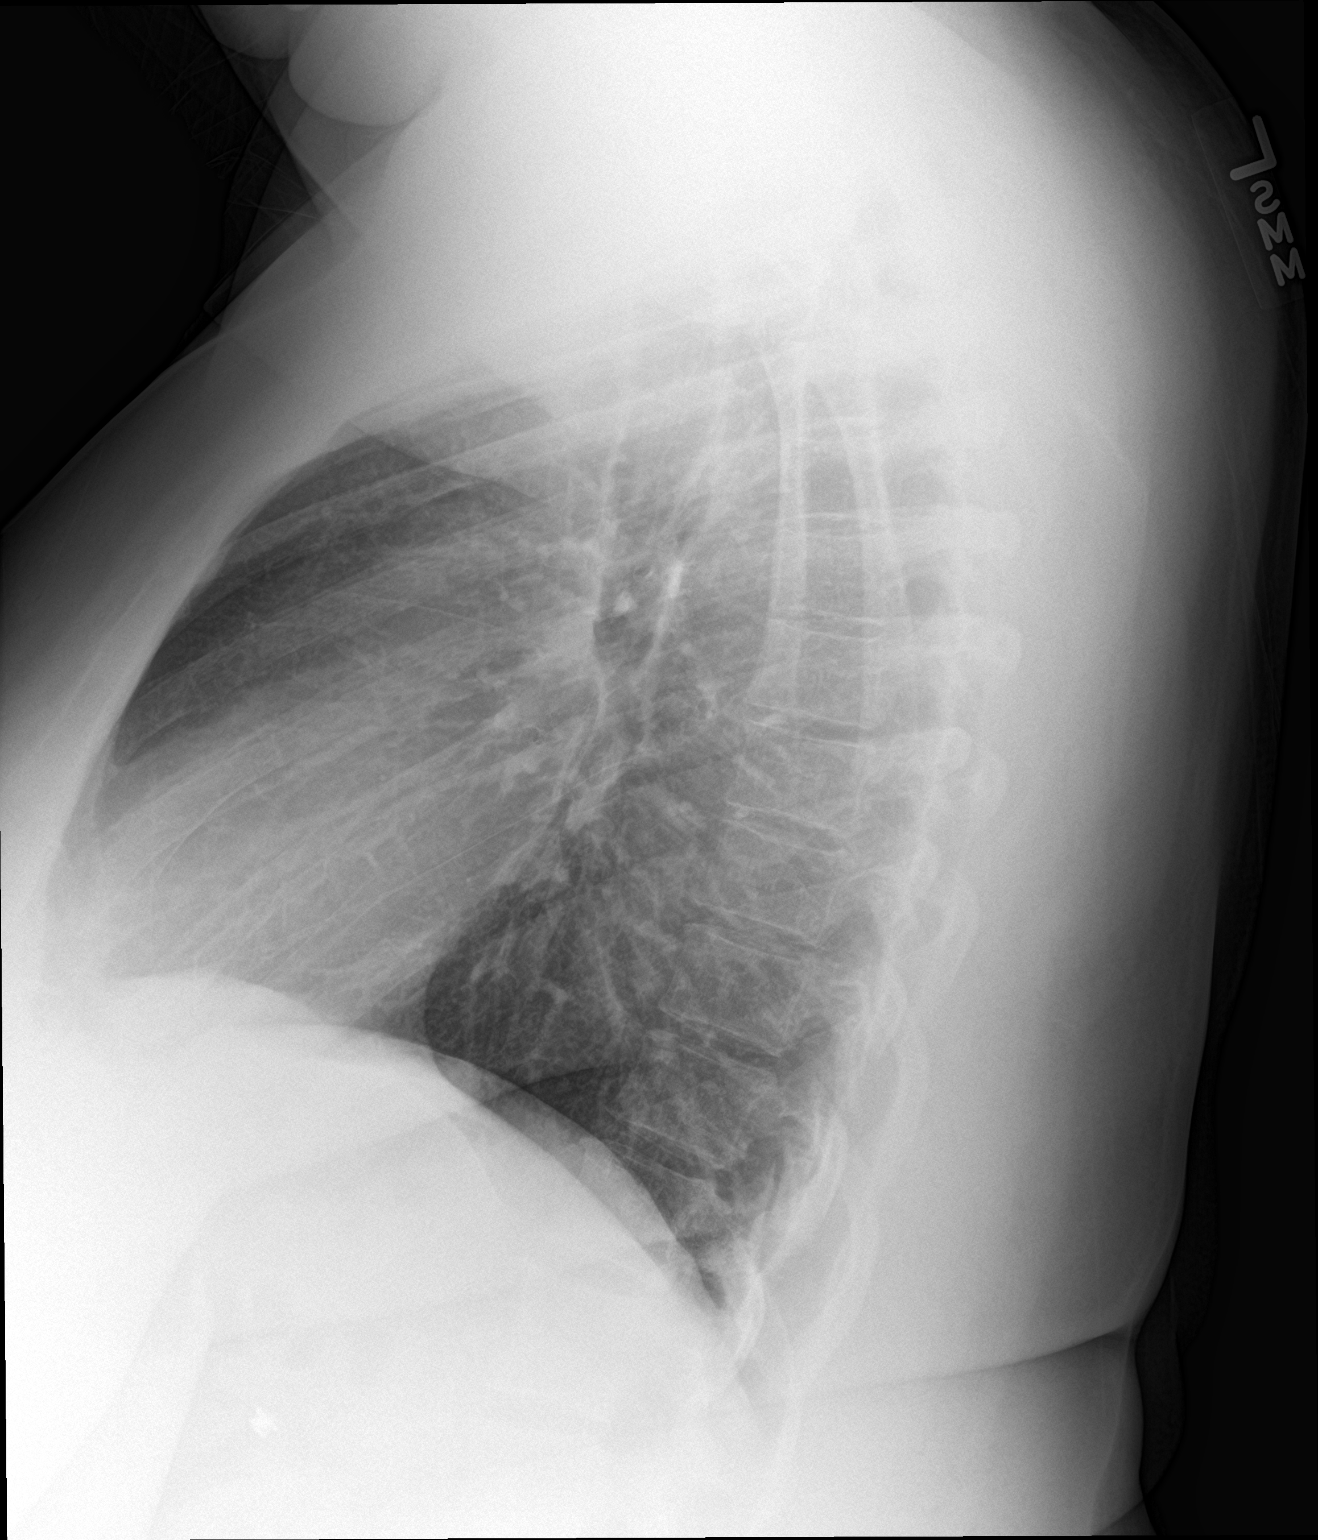

[2 of 2 positions shown; findings below may reference images not displayed]

FINDINGS: The heart size and mediastinal contours are within normal limits.
There is no focal infiltrate, pulmonary edema, or pleural effusion.
The visualized skeletal structures are unremarkable.
IMPRESSION: No active cardiopulmonary disease.

## 2018-03-12 ENCOUNTER — Encounter (HOSPITAL_COMMUNITY): Payer: Self-pay | Admitting: Emergency Medicine

## 2018-03-12 ENCOUNTER — Ambulatory Visit (HOSPITAL_COMMUNITY)
Admission: EM | Admit: 2018-03-12 | Discharge: 2018-03-12 | Disposition: A | Discharge status: Home / Self Care | Payer: Medicaid Other | Attending: Family Medicine | Admitting: Family Medicine

## 2018-03-12 DIAGNOSIS — J00 Acute nasopharyngitis [common cold]: Secondary | ICD-10-CM

## 2018-03-12 MED ORDER — GUAIFENESIN ER 600 MG PO TB12: 600.0000 mg | ORAL_TABLET | Freq: Two times a day (BID) | ORAL | 0 refills | Status: DC

## 2018-03-12 MED ORDER — FLUTICASONE PROPIONATE 50 MCG/ACT NA SUSP: 2.0000 | Freq: Every day | NASAL | 0 refills | Status: DC

## 2018-03-12 MED ORDER — IBUPROFEN 800 MG PO TABS: 800.0000 mg | ORAL_TABLET | Freq: Three times a day (TID) | ORAL | 0 refills | Status: DC

## 2018-03-12 MED ORDER — CROMOLYN SODIUM 5.2 MG/ACT NA AERS: 1.0000 | INHALATION_SPRAY | Freq: Four times a day (QID) | NASAL | 12 refills | Status: DC

## 2018-03-12 MED ORDER — FLUTICASONE PROPIONATE 50 MCG/ACT NA SUSP
2.0000 | Freq: Every day | NASAL | 0 refills | Status: DC
Start: 2018-03-12 — End: 2018-03-12

## 2018-03-12 NOTE — ED Provider Notes (Signed)
[facility]    CSN: [id number] Arrival date & time: 03/12/18  1658     History   Chief Complaint Chief Complaint  Patient presents with  . Otalgia  . Sore Throat    HPI Meghan Hughes is a 37 y.o. female.   [place] presents with complaints of sore throat, right facial pain and ear pain which started last night. Poor sleep last night due to sore throat. Slight headache. No fevers. No cough. No nasal drainage. No known ill contacts. Has been using halls and recola which have not helped with throat pain. States tends to get sinus infections. Has not taken any other medications for pain or symptoms. Without gi/gu complaints. Doesn't take any medications regularly.   ROS per HPI.      Past Medical History:  Diagnosis Date  . Diabetes mellitus 2012   gestational/insulin    Patient Active Problem List   Diagnosis Date Noted  . Gestational diabetes mellitus in pregnancy, insulin controlled 04/24/2011  . Previous cesarean section 04/24/2011    Past Surgical History:  Procedure Laterality Date  . CESAREAN SECTION  2011  . CHOLECYSTECTOMY  2006  . ECTOPIC PREGNANCY SURGERY  2009    OB History    Gravida  3   Para  2   Term  1   Preterm      AB  1   Living  2     SAB      TAB      Ectopic  1   Multiple      Live Births  2            Home Medications    Prior to Admission medications   Medication Sig Start Date End Date Taking? Authorizing Provider  Aspirin-Salicylamide-Caffeine (BC HEADACHE PO) Take 1 packet by mouth 2 (two) times daily as needed (cold symptoms).    [provider]  aspirin-sod bicarb-citric acid (ALKA-SELTZER) 325 MG TBEF tablet Take 325 mg by mouth every 6 (six) hours as needed.    [provider]  cromolyn (NASALCROM) 5.2 MG/ACT nasal spray Place 1 spray into both nostrils 4 (four) times daily. 03/12/18   Georgetta Haber, NP  fluticasone (FLONASE) 50 MCG/ACT nasal spray Place 2 sprays into both  nostrils daily. 03/12/18   Georgetta Haber, NP  guaiFENesin (MUCINEX) 600 MG 12 hr tablet Take 1 tablet (600 mg total) by mouth 2 (two) times daily. 03/12/18   Georgetta Haber, NP  ibuprofen (ADVIL,MOTRIN) 800 MG tablet Take 1 tablet (800 mg total) by mouth 3 (three) times daily. 03/12/18   Georgetta Haber, NP  Phenyleph-Doxylamine-DM-APAP (ALKA SELTZER PLUS PO) Take 1 tablet by mouth daily as needed (cold symptoms).    [provider]    Family History No family history on file.  Social History Social History   Tobacco Use  . Smoking status: Current Every Day Smoker    Years: 15.00    Types: Cigarettes  . Smokeless tobacco: Never Used  Substance Use Topics  . Alcohol use: No  . Drug use: Yes    Types: Marijuana    Comment: only 2 cigarettes per day/ before pregnant     Allergies   Patient has no known allergies.   Review of Systems Review of Systems   Physical Exam Triage Vital Signs ED Triage Vitals  Enc Vitals Group     BP 03/12/18 1715 117/75     Pulse Rate 03/12/18 1714 92  Resp 03/12/18 1714 18     Temp 03/12/18 1714 98.6 F (37 C)     Temp src --      SpO2 03/12/18 1714 100 %     Weight --      Height --      Head Circumference --      Peak Flow --      Pain Score --      Pain Loc --      Pain Edu? --      Excl. in GC? --    No data found.  Updated Vital Signs BP 117/75   Pulse 92   Temp 98.6 F (37 C)   Resp 18   SpO2 100%    Physical Exam  Constitutional: She is oriented to person, place, and time. She appears well-developed and well-nourished. No distress.  HENT:  Head: Normocephalic and atraumatic.  Right Ear: Tympanic membrane, external ear and ear canal normal.  Left Ear: Tympanic membrane, external ear and ear canal normal.  Nose: Mucosal edema present. Right sinus exhibits maxillary sinus tenderness.  Mouth/Throat: Uvula is midline, oropharynx is clear and moist and mucous membranes are normal. No tonsillar exudate.    Eyes: Pupils are equal, round, and reactive to light. Conjunctivae and EOM are normal.  Cardiovascular: Normal rate, regular rhythm and normal heart sounds.  Pulmonary/Chest: Effort normal and breath sounds normal.  Neurological: She is alert and oriented to person, place, and time.  Skin: Skin is warm and dry.     UC Treatments / Results  Labs (all labs ordered are listed, but only abnormal results are displayed) Labs Reviewed - No data to display  EKG None  Radiology No results found.  Procedures Procedures (including critical care time)  Medications Ordered in UC Medications - No data to display  Initial Impression / Assessment and Plan / UC Course  I have reviewed the triage vital signs and the nursing notes.  Pertinent labs & imaging results that were available during my care of the patient were reviewed by me and considered in my medical decision making (see chart for details).     History and physical consistent with viral illness.  Supportive cares recommended. Push fluids. Return precautions provided. Patient verbalized understanding and agreeable to plan.    Final Clinical Impressions(s) / UC Diagnoses   Final diagnoses:  Acute nasopharyngitis     Discharge Instructions     Push fluids to ensure adequate hydration and keep secretions thin.  Tylenol and/or ibuprofen as needed for pain or fevers.  Daily flonase. Cromolyn nasal spray up to 4 times a day as needed. Mucinex may also be helpful.  If symptoms worsen or do not improve in the next week to return to be seen or to follow up with your PCP.     ED Prescriptions    Medication Sig Dispense Auth. Provider   guaiFENesin (MUCINEX) 600 MG 12 hr tablet  (Status: Discontinued) Take 1 tablet (600 mg total) by mouth 2 (two) times daily. 12 tablet Haneef Hallquist, Dorene Grebe B, NP   fluticasone (FLONASE) 50 MCG/ACT nasal spray  (Status: Discontinued) Place 2 sprays into both nostrils daily. 16 g Linus Mako B, NP    cromolyn (NASALCROM) 5.2 MG/ACT nasal spray Place 1 spray into both nostrils 4 (four) times daily. 26 mL Linus Mako B, NP   ibuprofen (ADVIL,MOTRIN) 800 MG tablet Take 1 tablet (800 mg total) by mouth 3 (three) times daily. 21 tablet Georgetta Haber, NP  fluticasone (FLONASE) 50 MCG/ACT nasal spray Place 2 sprays into both nostrils daily. 16 g Linus MakoBurky, Donovan Persley B, NP   guaiFENesin (MUCINEX) 600 MG 12 hr tablet Take 1 tablet (600 mg total) by mouth 2 (two) times daily. 12 tablet Georgetta HaberBurky, Harrington Jobe B, NP     Controlled Substance Prescriptions [place] Controlled Substance Registry consulted? Not Applicable   Georgetta HaberBurky, Edelyn Heidel B, NP 03/12/18 1739

## 2018-03-12 NOTE — ED Triage Notes (Signed)
Pt c/o facial pain, R ear pain, sore throat since yesterday.

## 2018-03-12 NOTE — Discharge Instructions (Signed)
Push fluids to ensure adequate hydration and keep secretions thin.  Tylenol and/or ibuprofen as needed for pain or fevers.  Daily flonase. Cromolyn nasal spray up to 4 times a day as needed. Mucinex may also be helpful.  If symptoms worsen or do not improve in the next week to return to be seen or to follow up with your PCP.

## 2018-09-17 ENCOUNTER — Encounter (HOSPITAL_COMMUNITY): Payer: Self-pay | Admitting: Emergency Medicine

## 2018-09-17 ENCOUNTER — Ambulatory Visit (HOSPITAL_COMMUNITY)
Admission: EM | Admit: 2018-09-17 | Discharge: 2018-09-17 | Disposition: A | Discharge status: Home / Self Care | Payer: Medicaid Other | Attending: Internal Medicine | Admitting: Internal Medicine

## 2018-09-17 DIAGNOSIS — H1089 Other conjunctivitis: Secondary | ICD-10-CM | POA: Diagnosis not present

## 2018-09-17 MED ORDER — NEOMYCIN-POLYMYXIN-HC 3.5-10000-1 OT SUSP: OTIC | 0 refills | Status: AC

## 2018-09-17 MED ORDER — LORATADINE 10 MG PO TABS: 10.0000 mg | ORAL_TABLET | Freq: Every day | ORAL | 1 refills | Status: DC

## 2018-09-17 NOTE — ED Triage Notes (Signed)
Pt c/o spots in her eyes, feels like theres dirt in them. x3 days.

## 2018-09-17 NOTE — ED Provider Notes (Signed)
[facility]    CSN: [id number] Arrival date & time: 09/17/18  1655     History   Chief Complaint Chief Complaint  Patient presents with  . Eye Problem    HPI Meghan Hughes is a 37 y.o. female.   37 year old female presents to urgent care complaining of eye irritation x3 days.  The patient states that her eyes itch and that her vision is blurry.  She has awaken with mucus feeling both eyes the last 2 days.  She denies fevers, nausea, vomiting or body aches.  Denies sick contacts.  Denies new exposures.  She denies that her eyes feel swollen but are nonpainful.     Past Medical History:  Diagnosis Date  . Diabetes mellitus 2012   gestational/insulin    Patient Active Problem List   Diagnosis Date Noted  . Gestational diabetes mellitus in pregnancy, insulin controlled 04/24/2011  . Previous cesarean section 04/24/2011    Past Surgical History:  Procedure Laterality Date  . CESAREAN SECTION  2011  . CHOLECYSTECTOMY  2006  . ECTOPIC PREGNANCY SURGERY  2009    OB History    Gravida  3   Para  2   Term  1   Preterm      AB  1   Living  2     SAB      TAB      Ectopic  1   Multiple      Live Births  2            Home Medications    Prior to Admission medications   Medication Sig Start Date End Date Taking? Authorizing Provider  Aspirin-Salicylamide-Caffeine (BC HEADACHE PO) Take 1 packet by mouth 2 (two) times daily as needed (cold symptoms).    [provider]  aspirin-sod bicarb-citric acid (ALKA-SELTZER) 325 MG TBEF tablet Take 325 mg by mouth every 6 (six) hours as needed.    [provider]  cromolyn (NASALCROM) 5.2 MG/ACT nasal spray Place 1 spray into both nostrils 4 (four) times daily. Patient not taking: Reported on 09/17/2018 03/12/18   Georgetta Haber, NP  fluticasone (FLONASE) 50 MCG/ACT nasal spray Place 2 sprays into both nostrils daily. Patient not taking: Reported on 09/17/2018 03/12/18   Georgetta Haber, NP  guaiFENesin (MUCINEX) 600 MG 12 hr tablet Take 1 tablet (600 mg total) by mouth 2 (two) times daily. Patient not taking: Reported on 09/17/2018 03/12/18   Linus Mako B, NP  ibuprofen (ADVIL,MOTRIN) 800 MG tablet Take 1 tablet (800 mg total) by mouth 3 (three) times daily. 03/12/18   Georgetta Haber, NP  loratadine (CLARITIN) 10 MG tablet Take 1 tablet (10 mg total) by mouth daily. 09/17/18   Arnaldo Natal, MD  neomycin-polymyxin-hydrocortisone (CORTISPORIN) 3.5-10000-1 OTIC suspension 4 drops to both eyes three times per day 09/17/18 09/21/18  Arnaldo Natal, MD  Phenyleph-Doxylamine-DM-APAP (ALKA SELTZER PLUS PO) Take 1 tablet by mouth daily as needed (cold symptoms).    [provider]    Family History No family history on file.  Social History Social History   Tobacco Use  . Smoking status: Current Every Day Smoker    Years: 15.00    Types: Cigarettes  . Smokeless tobacco: Never Used  Substance Use Topics  . Alcohol use: No  . Drug use: Yes    Types: Marijuana    Comment: only 2 cigarettes per day/ before pregnant     Allergies  Patient has no known allergies.   Review of Systems Review of Systems  Constitutional: Negative for chills and fever.  HENT: Negative for sore throat and tinnitus.   Eyes: Positive for discharge, itching and visual disturbance. Negative for redness.  Respiratory: Negative for cough and shortness of breath.   Cardiovascular: Negative for chest pain and palpitations.  Gastrointestinal: Negative for abdominal pain, diarrhea, nausea and vomiting.  Genitourinary: Negative for dysuria, frequency and urgency.  Musculoskeletal: Negative for myalgias.  Skin: Negative for rash.       No lesions  Neurological: Negative for weakness.  Hematological: Does not bruise/bleed easily.  Psychiatric/Behavioral: Negative for suicidal ideas.     Physical Exam Triage Vital Signs ED Triage Vitals  Enc Vitals Group     BP  09/17/18 1758 (!) 143/87     Pulse Rate 09/17/18 1756 (!) 106     Resp 09/17/18 1756 16     Temp 09/17/18 1756 98.1 F (36.7 C)     Temp src --      SpO2 09/17/18 1756 100 %     Weight --      Height --      Head Circumference --      Peak Flow --      Pain Score 09/17/18 1757 8     Pain Loc --      Pain Edu? --      Excl. in GC? --    No data found.  Updated Vital Signs BP (!) 143/87   Pulse (!) 106   Temp 98.1 F (36.7 C)   Resp 16   SpO2 100%   Visual Acuity Right Eye Distance:   Left Eye Distance:   Bilateral Distance:    Right Eye Near: R Near: 20/25 Left Eye Near:  L Near: 20/20 Bilateral Near:  20/20  Physical Exam Vitals signs and nursing note reviewed.  Constitutional:      General: She is not in acute distress.    Appearance: She is well-developed.  HENT:     Head: Normocephalic and atraumatic.  Eyes:     General: No scleral icterus.    Extraocular Movements: Extraocular movements intact.     Pupils: Pupils are equal, round, and reactive to light.     Funduscopic exam:    Right eye: No papilledema.        Left eye: No papilledema.     Comments: Conjunctiva irritated bilaterally  Neck:     Musculoskeletal: Normal range of motion and neck supple.     Thyroid: No thyromegaly.     Vascular: No JVD.     Trachea: No tracheal deviation.  Cardiovascular:     Rate and Rhythm: Normal rate and regular rhythm.     Heart sounds: Normal heart sounds. No murmur. No friction rub. No gallop.   Pulmonary:     Effort: Pulmonary effort is normal.     Breath sounds: Normal breath sounds.  Abdominal:     General: Bowel sounds are normal. There is no distension.     Palpations: Abdomen is soft.     Tenderness: There is no abdominal tenderness.  Musculoskeletal: Normal range of motion.  Lymphadenopathy:     Cervical: No cervical adenopathy.  Skin:    General: Skin is warm and dry.  Neurological:     Mental Status: She is alert and oriented to person, place,  and time.     Cranial Nerves: No cranial nerve deficit.  Psychiatric:  Behavior: Behavior normal.        Thought Content: Thought content normal.        Judgment: Judgment normal.      UC Treatments / Results  Labs (all labs ordered are listed, but only abnormal results are displayed) Labs Reviewed - No data to display  EKG None  Radiology No results found.  Procedures Procedures (including critical care time)  Medications Ordered in UC Medications - No data to display  Initial Impression / Assessment and Plan / UC Course  I have reviewed the triage vital signs and the nursing notes.  Pertinent labs & imaging results that were available during my care of the patient were reviewed by me and considered in my medical decision making (see chart for details).     No ulceration or pain.  Unspecified conjunctivitis.  Patient to follow with ophthalmology next week.  Final Clinical Impressions(s) / UC Diagnoses   Final diagnoses:  Other conjunctivitis of both eyes   Discharge Instructions   None    ED Prescriptions    Medication Sig Dispense Auth. Provider   neomycin-polymyxin-hydrocortisone (CORTISPORIN) 3.5-10000-1 OTIC suspension 4 drops to both eyes three times per day 10 mL Arnaldo Natal, MD   loratadine (CLARITIN) 10 MG tablet Take 1 tablet (10 mg total) by mouth daily. 30 tablet Arnaldo Natal, MD     Controlled Substance Prescriptions [place] Controlled Substance Registry consulted? Not Applicable   Arnaldo Natal, MD 09/17/18 [phone]

## 2020-02-12 ENCOUNTER — Ambulatory Visit (INDEPENDENT_AMBULATORY_CARE_PROVIDER_SITE_OTHER)
Admission: RE | Admit: 2020-02-12 | Discharge: 2020-02-12 | Disposition: A | Discharge status: Home / Self Care | Payer: Medicaid Other | Source: Ambulatory Visit

## 2020-02-12 DIAGNOSIS — R05 Cough: Secondary | ICD-10-CM

## 2020-02-12 DIAGNOSIS — R059 Cough, unspecified: Secondary | ICD-10-CM

## 2020-02-12 DIAGNOSIS — J3489 Other specified disorders of nose and nasal sinuses: Secondary | ICD-10-CM

## 2020-02-12 DIAGNOSIS — J0110 Acute frontal sinusitis, unspecified: Secondary | ICD-10-CM

## 2020-02-12 DIAGNOSIS — R0981 Nasal congestion: Secondary | ICD-10-CM | POA: Diagnosis not present

## 2020-02-12 MED ORDER — AMOXICILLIN 500 MG PO TABS: 500.0000 mg | ORAL_TABLET | Freq: Two times a day (BID) | ORAL | 0 refills | Status: AC

## 2020-02-12 MED ORDER — BENZONATATE 100 MG PO CAPS: 100.0000 mg | ORAL_CAPSULE | Freq: Three times a day (TID) | ORAL | 0 refills | Status: DC

## 2020-02-12 NOTE — Discharge Instructions (Signed)
You have a sinus infection.   I have sent in amoxicillin for you to take twice daily for 10 days.  I have also sent in tessalon perles for you take one capsule every 8 hours as needed for your cough.  You may take ibuprofen and tylenol as needed for headaches, fever.  Follow up if you are not feeling better over the next 2 days with primary care or with this office.

## 2020-02-12 NOTE — ED Provider Notes (Signed)
[place]  Virtual Visit via Video Note:  Meghan Hughes  initiated request for Telemedicine visit with [facility] team. I connected with Meghan Hughes  on 02/12/2020 at 12:37 PM  for a synchronized telemedicine visit using a video enabled HIPPA compliant telemedicine application. I verified that I am speaking with Meghan Hughes  using two identifiers. Bettey Mare, NP  was physically located in a [facility] Urgent care site and Meghan Hughes was located at a different location.   The limitations of evaluation and management by telemedicine as well as the availability of in-person appointments were discussed. Patient was informed that she  may incur a bill ( including co-pay) for this virtual visit encounter. Meghan Hughes  expressed understanding and gave verbal consent to proceed with virtual visit.   [id number] 02/12/20 Arrival Time: 8527  PO:EUMP THROAT  SUBJECTIVE: History from: patient.  Meghan Hughes is a 39 y.o. female who presents with gradual onset of sinus pain and increased nasal congestion with dark yellow, dark green nasal discharge over the last week. Reports cough, chills, sinus pain. Denies sick exposure to Covid, strep, flu or mono, or precipitating event.  Has tried OTC alka seltzer cold and sinus without relief.  There are no aggravating factors.  Reports previous symptoms in the past, states that she usually gets a sinus infection every year.  Denies fever, fatigue, ear pain, rhinorrhea, SOB, wheezing, chest pain, nausea, rash, changes in bowel or bladder habits.     ROS: As per HPI.  All other pertinent ROS negative.     Past Medical History:  Diagnosis Date  . Diabetes mellitus 2012   gestational/insulin   Past Surgical History:  Procedure Laterality Date  . CESAREAN SECTION  2011  . CHOLECYSTECTOMY  2006  . ECTOPIC PREGNANCY SURGERY  2009   No Known Allergies No current facility-administered medications on file prior to encounter.    Current Outpatient Medications on File Prior to Encounter  Medication Sig Dispense Refill  . Aspirin-Salicylamide-Caffeine (BC HEADACHE PO) Take 1 packet by mouth 2 (two) times daily as needed (cold symptoms).    [name] aspirin-sod bicarb-citric acid (ALKA-SELTZER) 325 MG TBEF tablet Take 325 mg by mouth every 6 (six) hours as needed.    . cromolyn (NASALCROM) 5.2 MG/ACT nasal spray Place 1 spray into both nostrils 4 (four) times daily. (Patient not taking: Reported on 09/17/2018) 26 mL 12  . fluticasone (FLONASE) 50 MCG/ACT nasal spray Place 2 sprays into both nostrils daily. (Patient not taking: Reported on 09/17/2018) 16 g 0  . guaiFENesin (MUCINEX) 600 MG 12 hr tablet Take 1 tablet (600 mg total) by mouth 2 (two) times daily. (Patient not taking: Reported on 09/17/2018) 12 tablet 0  . ibuprofen (ADVIL,MOTRIN) 800 MG tablet Take 1 tablet (800 mg total) by mouth 3 (three) times daily. 21 tablet 0  . loratadine (CLARITIN) 10 MG tablet Take 1 tablet (10 mg total) by mouth daily. 30 tablet 1  . Phenyleph-Doxylamine-DM-APAP (ALKA SELTZER PLUS PO) Take 1 tablet by mouth daily as needed (cold symptoms).     Social History   Socioeconomic History  . Marital status: Single    Spouse name: Not on file  . Number of children: Not on file  . Years of education: Not on file  . Highest education level: Not on file  Occupational History  . Not on file  Tobacco Use  . Smoking status: Current Every Day Smoker  Years: 15.00    Types: Cigarettes  . Smokeless tobacco: Never Used  Substance and Sexual Activity  . Alcohol use: No  . Drug use: Yes    Types: Marijuana    Comment: only 2 cigarettes per day/ before pregnant  . Sexual activity: Yes    Birth control/protection: None  Other Topics Concern  . Not on file  Social History Narrative  . Not on file   Social Determinants of Health   Financial Resource Strain:   . Difficulty of Paying Living Expenses:   Food Insecurity:   . Worried About  Programme researcher, broadcasting/film/video in the Last Year:   . Barista in the Last Year:   Transportation Needs:   . Freight forwarder (Medical):   [name] Lack of Transportation (Non-Medical):   Physical Activity:   . Days of Exercise per Week:   . Minutes of Exercise per Session:   Stress:   . Feeling of Stress :   Social Connections:   . Frequency of Communication with Friends and Family:   . Frequency of Social Gatherings with Friends and Family:   . Attends Religious Services:   . Active Member of Clubs or Organizations:   . Attends Banker Meetings:   [name] Marital Status:   Intimate Partner Violence:   . Fear of Current or Ex-Partner:   . Emotionally Abused:   [name] Physically Abused:   . Sexually Abused:    No family history on file.  OBJECTIVE:   There were no vitals filed for this visit.  General appearance: alert; no distress Eyes: EOMI grossly HENT: normocephalic; atraumatic Neck: supple with FROM Lungs: normal respiratory effort; speaking in full sentences without difficulty Extremities: moves extremities without difficulty Skin: No obvious rashes Neurologic: No facial asymmetries Psychological: alert and cooperative; normal mood and affect  ASSESSMENT & PLAN:  1. Acute non-recurrent frontal sinusitis   2. Nasal congestion   3. Sinus pain   4. Cough     Meds ordered this encounter  Medications  . benzonatate (TESSALON) 100 MG capsule    Sig: Take 1 capsule (100 mg total) by mouth every 8 (eight) hours.    Dispense:  21 capsule    Refill:  0    Order Specific Question:   Supervising Provider    Answer:   Merrilee Jansky [id number]  . amoxicillin (AMOXIL) 500 MG tablet    Sig: Take 1 tablet (500 mg total) by mouth 2 (two) times daily for 10 days.    Dispense:  20 tablet    Refill:  0    Order Specific Question:   Supervising Provider    Answer:   Merrilee Jansky [id number]     Push fluids and get rest Prescribed amoxicillin 500mg  twice daily for 10  days.  Take as directed and to completion.  Prescribed tessalon perles for cough. Take daily OTC antihistamine. Drink warm or cool liquids, use throat lozenges, or popsicles to help alleviate symptoms Take OTC ibuprofen or tylenol as needed for pain Follow up with PCP if symptoms persist Return or go to ER if you have any new or worsening symptoms such as fever, chills, nausea, vomiting, worsening sore throat, cough, abdominal pain, chest pain, changes in bowel or bladder habits.  I discussed the assessment and treatment plan with the patient. The patient was provided an opportunity to ask questions and all were answered. The patient agreed with the plan and demonstrated an understanding of the  instructions.   The patient was advised to call back or seek an in-person evaluation if the symptoms worsen or if the condition fails to improve as anticipated.  Work note provided.  I provided 10 minutes of non-face-to-face time during this encounter.  Marykay Lex, NP  02/12/2020 12:37 PM         Moshe Cipro, NP 02/12/20 1237

## 2020-03-05 ENCOUNTER — Encounter (HOSPITAL_COMMUNITY): Payer: Self-pay

## 2020-03-05 ENCOUNTER — Other Ambulatory Visit: Payer: Self-pay

## 2020-03-05 ENCOUNTER — Ambulatory Visit (HOSPITAL_COMMUNITY)
Admission: EM | Admit: 2020-03-05 | Discharge: 2020-03-05 | Disposition: A | Discharge status: Home / Self Care | Payer: Medicaid Other | Attending: Internal Medicine | Admitting: Internal Medicine

## 2020-03-05 DIAGNOSIS — L0201 Cutaneous abscess of face: Secondary | ICD-10-CM

## 2020-03-05 MED ORDER — IBUPROFEN 400 MG PO TABS: 400.0000 mg | ORAL_TABLET | Freq: Four times a day (QID) | ORAL | 0 refills | Status: DC | PRN

## 2020-03-05 MED ORDER — BACITRACIN ZINC 500 UNIT/GM EX OINT: 1.0000 "application " | TOPICAL_OINTMENT | Freq: Two times a day (BID) | CUTANEOUS | 0 refills | Status: AC

## 2020-03-05 MED ORDER — DOXYCYCLINE HYCLATE 100 MG PO CAPS: 100.0000 mg | ORAL_CAPSULE | Freq: Two times a day (BID) | ORAL | 0 refills | Status: AC

## 2020-03-05 NOTE — ED Triage Notes (Signed)
Pt c/o abscess behind right earx5 days. Pt has a samll abscess behind right ear and the area is 1+ edema.

## 2020-03-05 NOTE — ED Provider Notes (Addendum)
[facility]    CSN: [id number] Arrival date & time: 03/05/20  0856      History   Chief Complaint Chief Complaint  Patient presents with  . Abscess    HPI Meghan Hughes is a 39 y.o. female comes to the urgent care with painful swelling behind the right ear of 5 days duration.  Patient says the symptoms started insidiously and is gotten progressively worse.  She noticed swelling a couple of days ago and the swelling has been getting worse.  Pain is currently constant, throbbing, 10 out of 10, aggravated by palpation and no relieving factors.  Patient denies any fever or chills.[name]   HPI  Past Medical History:  Diagnosis Date  . Diabetes mellitus 2012   gestational/insulin    Patient Active Problem List   Diagnosis Date Noted  . Gestational diabetes mellitus in pregnancy, insulin controlled 04/24/2011  . Previous cesarean section 04/24/2011    Past Surgical History:  Procedure Laterality Date  . CESAREAN SECTION  2011  . CHOLECYSTECTOMY  2006  . ECTOPIC PREGNANCY SURGERY  2009    OB History    Gravida  3   Para  2   Term  1   Preterm      AB  1   Living  2     SAB      TAB      Ectopic  1   Multiple      Live Births  2            Home Medications    Prior to Admission medications   Medication Sig Start Date End Date Taking? Authorizing Provider  bacitracin ointment Apply 1 application topically 2 (two) times daily. 03/05/20   Mcadoo Muzquiz, Britta Mccreedy, MD  doxycycline (VIBRAMYCIN) 100 MG capsule Take 1 capsule (100 mg total) by mouth 2 (two) times daily for 5 days. 03/05/20 03/10/20  Merrilee Jansky, MD  ibuprofen (ADVIL) 400 MG tablet Take 1 tablet (400 mg total) by mouth every 6 (six) hours as needed. 03/05/20   Hagop Mccollam, Britta Mccreedy, MD  cromolyn (NASALCROM) 5.2 MG/ACT nasal spray Place 1 spray into both nostrils 4 (four) times daily. Patient not taking: Reported on 09/17/2018 03/12/18 03/05/20  Georgetta Haber, NP  fluticasone (FLONASE) 50 MCG/ACT  nasal spray Place 2 sprays into both nostrils daily. Patient not taking: Reported on 09/17/2018 03/12/18 03/05/20  Georgetta Haber, NP  loratadine (CLARITIN) 10 MG tablet Take 1 tablet (10 mg total) by mouth daily. 09/17/18 03/05/20  Arnaldo Natal, MD    Family History Family History  Problem Relation Age of Onset  . Healthy Mother   . Healthy Father     Social History Social History   Tobacco Use  . Smoking status: Current Every Day Smoker    Packs/day: 0.50    Years: 15.00    Pack years: 7.50    Types: Cigarettes  . Smokeless tobacco: Never Used  Substance Use Topics  . Alcohol use: No  . Drug use: Yes    Types: Marijuana    Comment: only 2 cigarettes per day/ before pregnant     Allergies   Patient has no known allergies.   Review of Systems Review of Systems  Constitutional: Negative for activity change, diaphoresis and unexpected weight change.  HENT: Negative.   Respiratory: Negative.   Gastrointestinal: Negative.   Musculoskeletal: Negative.   Skin: Positive for color change and rash.  Psychiatric/Behavioral: Negative for confusion and  decreased concentration.     Physical Exam Triage Vital Signs ED Triage Vitals [03/05/20 0911]  Enc Vitals Group     BP 137/79     Pulse Rate (!) 104     Resp 16     Temp 98.2 F (36.8 C)     Temp Source Oral     SpO2 97 %     Weight 225 lb (102.1 kg)     Height 5\' 6"  (1.676 m)     Head Circumference      Peak Flow      Pain Score 10     Pain Loc      Pain Edu?      Excl. in [place]?    No data found.  Updated Vital Signs BP 137/79   Pulse (!) 104   Temp 98.2 F (36.8 C) (Oral)   Resp 16   Ht 5\' 6"  (1.676 m)   Wt 102.1 kg   SpO2 97%   BMI 36.32 kg/m   Visual Acuity Right Eye Distance:   Left Eye Distance:   Bilateral Distance:    Right Eye Near:   Left Eye Near:    Bilateral Near:     Physical Exam Constitutional:      General: She is not in acute distress.    Appearance: Normal appearance.  She is not ill-appearing.  Pulmonary:     Effort: Pulmonary effort is normal.     Breath sounds: Normal breath sounds. No stridor. No wheezing or rhonchi.  Abdominal:     General: Bowel sounds are normal.     Palpations: Abdomen is soft.  Musculoskeletal:        General: No swelling or tenderness. Normal range of motion.  Skin:    General: Skin is warm.     Comments: Abscess behind the right ear.  Measures about 1 inch in the longest diameter.  It is fluctuant.  No surrounding erythema.  Neurological:     Mental Status: She is alert.      UC Treatments / Results  Labs (all labs ordered are listed, but only abnormal results are displayed) Labs Reviewed - No data to display  EKG   Radiology No results found.  Procedures Incision and Drainage  Date/Time: 03/05/2020 5:55 PM Performed by: Chase Picket, MD Authorized by: Chase Picket, MD   Consent:    Consent obtained:  Verbal   Consent given by:  Patient   Risks discussed:  Bleeding and incomplete drainage   Alternatives discussed:  No treatment and delayed treatment Location:    Type:  Abscess   Location:  Head   Head location:  R external ear Anesthesia (see MAR for exact dosages):    Anesthesia method:  Local infiltration   Local anesthetic:  Lidocaine 2% w/o epi Procedure type:    Complexity:  Simple Procedure details:    Incision types:  Single straight   Incision depth:  Subcutaneous   Scalpel blade:  11   Wound management:  Probed and deloculated   Drainage:  Purulent   Drainage amount:  Scant   Wound treatment:  Wound left open   Packing materials:  None Post-procedure details:    Patient tolerance of procedure:  Tolerated well, no immediate complications   (including critical care time)  Medications Ordered in UC Medications - No data to display  Initial Impression / Assessment and Plan / UC Course  I have reviewed the triage vital signs and the nursing notes.  Pertinent labs & imaging  results that were available during my care of the patient were reviewed by me and considered in my medical decision making (see chart for details).     1.  Facial abscess: Incision and drainage of the abscess Doxycycline 100 mg twice daily for 5 days Ibuprofen 400 mg every 6 hours as needed for pain Return if you notice worsening pain, swelling, increased discharge, fever or chills. Final Clinical Impressions(s) / UC Diagnoses   Final diagnoses:  Facial abscess     Discharge Instructions     Daily dressing with bacitracin ointment Soap and water is okay after 24 hours Return to urgent care if you have worsening pain, swelling, erythema or discharge.   ED Prescriptions    Medication Sig Dispense Auth. Provider   doxycycline (VIBRAMYCIN) 100 MG capsule Take 1 capsule (100 mg total) by mouth 2 (two) times daily for 5 days. 10 capsule Kona Yusuf, Britta Mccreedy, MD   ibuprofen (ADVIL) 400 MG tablet Take 1 tablet (400 mg total) by mouth every 6 (six) hours as needed. 30 tablet Beena Catano, Britta Mccreedy, MD   bacitracin ointment Apply 1 application topically 2 (two) times daily. 120 g Shantel Helwig, Britta Mccreedy, MD     PDMP not reviewed this encounter.   Merrilee Jansky, MD 03/05/20 1754    Merrilee Jansky, MD 03/05/20 [phone]

## 2020-03-05 NOTE — Discharge Instructions (Signed)
Daily dressing with bacitracin ointment Soap and water is okay after 24 hours Return to urgent care if you have worsening pain, swelling, erythema or discharge.

## 2020-10-06 ENCOUNTER — Encounter: Payer: Self-pay | Admitting: *Deleted

## 2020-10-07 ENCOUNTER — Ambulatory Visit: Payer: Medicaid Other | Admitting: Neurology

## 2020-10-07 ENCOUNTER — Encounter: Payer: Self-pay | Admitting: Neurology

## 2020-10-07 NOTE — Progress Notes (Deleted)
[id number]    Provider:  Dr Lucia Gaskins Requesting Provider: Renaye Rakers, MD Primary Care Provider:  Renaye Rakers, MD  CC:  ***  HPI:  Meghan Hughes is a 40 y.o. female here as requested by Renaye Rakers, MD for headaches.  I reviewed Dr. Tedra Senegal notes, patient was seen in the emergency room for headache she was started on medications, when she saw Dr. Rosalie Gums in follow-up outpatient she did not feel any better, headaches appear tension in nature, patient said the medications were of no help, no significant past medical or surgical history, I reviewed her physical examination by Dr. Parke Simmers which showed some temporal tenderness and some distress otherwise normal eyes neck respiratory cardiovascular chest extremities, she was referred to neurology, citalopram was added, meloxicam and Vistaril nightly, sleep study was recommended, referred for stress management anxiety disorder.   Reviewed notes, labs and imaging from outside physicians, which showed ***  Review of Systems: Patient complains of symptoms per HPI as well as the following symptoms ***. Pertinent negatives and positives per HPI. All others negative.   Social History   Socioeconomic History  . Marital status: Single    Spouse name: Not on file  . Number of children: Not on file  . Years of education: Not on file  . Highest education level: Not on file  Occupational History  . Not on file  Tobacco Use  . Smoking status: Current Every Day Smoker    Packs/day: 0.50    Years: 15.00    Pack years: 7.50    Types: Cigarettes  . Smokeless tobacco: Never Used  Substance and Sexual Activity  . Alcohol use: No  . Drug use: Yes    Types: Marijuana    Comment: only 2 cigarettes per day/ before pregnant  . Sexual activity: Yes    Birth control/protection: None  Other Topics Concern  . Not on file  Social History Narrative  . Not on file   Social Determinants of Health   Financial Resource Strain: Not on file   Food Insecurity: Not on file  Transportation Needs: Not on file  Physical Activity: Not on file  Stress: Not on file  Social Connections: Not on file  Intimate Partner Violence: Not on file    Family History  Problem Relation Age of Onset  . Hypertension Mother   . Alcoholism Father        and on drugs  . Hypertension Maternal Grandmother   . Ovarian cancer Maternal Grandmother   . Heart disease Maternal Grandfather     Past Medical History:  Diagnosis Date  . Diabetes mellitus 2012   gestational/insulin    Patient Active Problem List   Diagnosis Date Noted  . Gestational diabetes mellitus in pregnancy, insulin controlled 04/24/2011  . Previous cesarean section 04/24/2011    Past Surgical History:  Procedure Laterality Date  . CESAREAN SECTION  2011  . CHOLECYSTECTOMY  2006  . ECTOPIC PREGNANCY SURGERY  2009    Current Outpatient Medications  Medication Sig Dispense Refill  . bacitracin ointment Apply 1 application topically 2 (two) times daily. 120 g 0  . ibuprofen (ADVIL) 400 MG tablet Take 1 tablet (400 mg total) by mouth every 6 (six) hours as needed. 30 tablet 0   No current facility-administered medications for this visit.    Allergies as of 10/07/2020  . (No Known Allergies)    Vitals: There were no vitals taken for this visit. Last Weight:  Wt Readings from Last  1 Encounters:  03/05/20 225 lb (102.1 kg)   Last Height:   Ht Readings from Last 1 Encounters:  03/05/20 5\' 6"  (1.676 m)     Physical exam: Exam: Gen: NAD, conversant, well nourised, obese, well groomed                     CV: RRR, no MRG. No Carotid Bruits. No peripheral edema, warm, nontender Eyes: Conjunctivae clear without exudates or hemorrhage  Neuro: Detailed Neurologic Exam  Speech:    Speech is normal; fluent and spontaneous with normal comprehension.  Cognition:    The patient is oriented to person, place, and time;     recent and remote memory intact;     language  fluent;     normal attention, concentration,     fund of knowledge Cranial Nerves:    The pupils are equal, round, and reactive to light. The fundi are normal and spontaneous venous pulsations are present. Visual fields are full to finger confrontation. Extraocular movements are intact. Trigeminal sensation is intact and the muscles of mastication are normal. The face is symmetric. The palate elevates in the midline. Hearing intact. Voice is normal. Shoulder shrug is normal. The tongue has normal motion without fasciculations.   Coordination:    Normal finger to nose and heel to shin. Normal rapid alternating movements.   Gait:    Heel-toe and tandem gait are normal.   Motor Observation:    No asymmetry, no atrophy, and no involuntary movements noted. Tone:    Normal muscle tone.    Posture:    Posture is normal. normal erect    Strength:    Strength is V/V in the upper and lower limbs.      Sensation: intact to LT     Reflex Exam:  DTR's:    Deep tendon reflexes in the upper and lower extremities are normal bilaterally.   Toes:    The toes are downgoing bilaterally.   Clonus:    Clonus is absent.    Assessment/Plan:    No orders of the defined types were placed in this encounter.  No orders of the defined types were placed in this encounter.   Cc: , MD,  Renaye Rakers, MD  Renaye Rakers, MD  [facility] [address] [place], [place]  Phone [phone] Fax [phone]

## 2021-03-07 ENCOUNTER — Encounter: Payer: Self-pay | Admitting: Family

## 2021-03-07 ENCOUNTER — Telehealth: Payer: Medicaid Other | Admitting: Family

## 2021-03-07 DIAGNOSIS — J069 Acute upper respiratory infection, unspecified: Secondary | ICD-10-CM

## 2021-03-07 MED ORDER — CETIRIZINE HCL 10 MG PO TABS: 10.0000 mg | ORAL_TABLET | Freq: Every day | ORAL | 11 refills | Status: AC

## 2021-03-07 MED ORDER — FLUTICASONE PROPIONATE 50 MCG/ACT NA SUSP: 2.0000 | Freq: Every day | NASAL | 6 refills | Status: AC

## 2021-03-07 NOTE — Progress Notes (Signed)
   Virtual Visit  Note Due to COVID-19 pandemic this visit was conducted virtually. This visit type was conducted due to national recommendations for restrictions regarding the COVID-19 Pandemic (e.g. social distancing, sheltering in place) in an effort to limit this patient's exposure and mitigate transmission in our community. All issues noted in this document were discussed and addressed.  A physical exam was not performed with this format.  I connected with Meghan Hughes on 03/07/21 at 3:42 pm by video and verified that I am speaking with the correct person using two identifiers. Meghan Hughes is currently located at home and no one  is currently with her during visit. The provider, Jannifer Rodney, FNP is located in their office at time of visit.  I discussed the limitations, risks, security and privacy concerns of performing an evaluation and management service by video and the availability of in person appointments. I also discussed with the patient that there may be a patient responsible charge related to this service. The patient expressed understanding and agreed to proceed.     History and Present Illness: Pt presents today with URI symptoms that started yesterday. She  took an at home COVID test that was negative.  Sinusitis This is a new problem. The current episode started yesterday. The problem is unchanged. There has been no fever. The pain is mild. Associated symptoms include congestion, headaches, sinus pressure, sneezing and a sore throat. Pertinent negatives include no chills, coughing, ear pain or shortness of breath. Past treatments include acetaminophen and oral decongestants. The treatment provided mild relief.      Review of Systems  Constitutional: Negative for chills.  HENT: Positive for congestion, sinus pressure, sneezing and sore throat. Negative for ear pain.   Respiratory: Negative for cough and shortness of breath.   Neurological: Positive for headaches.      Observations/Objective: No SOB or distress noted, nasal voice  Assessment and Plan: 1. Viral URI - Take meds as prescribed - Use a cool mist humidifier  -Use saline nose sprays frequently -Force fluids -For any cough or congestion  Use plain Mucinex- regular strength or max strength is fine -For fever or aces or pains- take tylenol or ibuprofen. -Throat lozenges if helps FOllow up if symptoms worsen or do not improve  - fluticasone (FLONASE) 50 MCG/ACT nasal spray; Place 2 sprays into both nostrils daily.  Dispense: 16 g; Refill: 6 - cetirizine (ZYRTEC) 10 MG tablet; Take 1 tablet (10 mg total) by mouth daily.  Dispense: 30 tablet; Refill: 11     I discussed the assessment and treatment plan with the patient. The patient was provided an opportunity to ask questions and all were answered. The patient agreed with the plan and demonstrated an understanding of the instructions.   The patient was advised to call back or seek an in-person evaluation if the symptoms worsen or if the condition fails to improve as anticipated.  The above assessment and management plan was discussed with the patient. The patient verbalized understanding of and has agreed to the management plan. Patient is aware to call the clinic if symptoms persist or worsen. Patient is aware when to return to the clinic for a follow-up visit. Patient educated on when it is appropriate to go to the emergency department.   Time call ended:  3:48 pm   I provided 6 minutes of   face-to-face time during this encounter.    Jannifer Rodney, FNP

## 2022-03-29 ENCOUNTER — Telehealth: Payer: Medicaid Other | Admitting: Emergency Medicine

## 2022-03-29 DIAGNOSIS — B9689 Other specified bacterial agents as the cause of diseases classified elsewhere: Secondary | ICD-10-CM | POA: Diagnosis not present

## 2022-03-29 DIAGNOSIS — J019 Acute sinusitis, unspecified: Secondary | ICD-10-CM | POA: Diagnosis not present

## 2022-03-29 MED ORDER — AMOXICILLIN-POT CLAVULANATE 875-125 MG PO TABS: 1.0000 | ORAL_TABLET | Freq: Two times a day (BID) | ORAL | 0 refills | Status: DC

## 2022-04-07 ENCOUNTER — Telehealth: Payer: Medicaid Other | Admitting: Family Medicine

## 2022-04-07 DIAGNOSIS — H6690 Otitis media, unspecified, unspecified ear: Secondary | ICD-10-CM

## 2022-04-07 MED ORDER — CIPROFLOXACIN-DEXAMETHASONE 0.3-0.1 % OT SUSP: 4.0000 [drp] | Freq: Two times a day (BID) | OTIC | 0 refills | Status: DC

## 2022-04-07 NOTE — Progress Notes (Signed)
Virtual Visit Consent   Meghan Hughes, you are scheduled for a virtual visit with a [facility] provider today. Just as with appointments in the office, your consent must be obtained to participate. Your consent will be active for this visit and any virtual visit you may have with one of our providers in the next 365 days. If you have a MyChart account, a copy of this consent can be sent to you electronically.  As this is a virtual visit, video technology does not allow for your provider to perform a traditional examination. This may limit your provider's ability to fully assess your condition. If your provider identifies any concerns that need to be evaluated in person or the need to arrange testing (such as labs, EKG, etc.), we will make arrangements to do so. Although advances in technology are sophisticated, we cannot ensure that it will always work on either your end or our end. If the connection with a video visit is poor, the visit may have to be switched to a telephone visit. With either a video or telephone visit, we are not always able to ensure that we have a secure connection.  By engaging in this virtual visit, you consent to the provision of healthcare and authorize for your insurance to be billed (if applicable) for the services provided during this visit. Depending on your insurance coverage, you may receive a charge related to this service.  I need to obtain your verbal consent now. Are you willing to proceed with your visit today? Meghan Hughes has provided verbal consent on 04/07/2022 for a virtual visit (video or telephone). Freddy Finner, NP  Date: 04/07/2022 1:29 PM  Virtual Visit via Video Note   I, Freddy Finner, connected with  Meghan Hughes  ([id number], 06-Oct-1980) on 04/07/22 at  2:00 PM EDT by a video-enabled telemedicine application and verified that I am speaking with the correct person using two identifiers.  Location: Patient: Virtual Visit Location Patient:  Home Provider: Virtual Visit Location Provider: Home Office   I discussed the limitations of evaluation and management by telemedicine and the availability of in person appointments. The patient expressed understanding and agreed to proceed.    History of Present Illness: Meghan Hughes is a 41 y.o. who identifies as a female who was assigned female at birth, and is being seen today for ear pain/infection. She was seen on a video visit 03/07/2022. Had viral sinus infection at that time. She followed up on 03/29/2022 and had bacterial sinus infection with treatment. Additionally at that time she had  left ear pressure and pain and she felt that radiating from her left ear down her jawline to her submandibular area.  She was using Zyrtec and Flonase with no relief this entire time until starting the Augmentin. She reports she gets a sinus infection like this every year and has not had antibiotics since her last one a year ago. She reports overall her symptoms have improved outside of her ear pain and discomfort. Denies popping, drainage or muffled hearing. Denies inner or outer ear pain to touching. Denies fevers or chills. Is unable to get a ride to local UC for in person.    Problems:  Patient Active Problem List   Diagnosis Date Noted   Gestational diabetes mellitus in pregnancy, insulin controlled 04/24/2011   Previous cesarean section 04/24/2011    Allergies: No Known Allergies Medications:  Current Outpatient Medications:    amoxicillin-clavulanate (AUGMENTIN) 875-125 MG tablet, Take  1 tablet by mouth 2 (two) times daily., Disp: 14 tablet, Rfl: 0   bacitracin ointment, Apply 1 application topically 2 (two) times daily., Disp: 120 g, Rfl: 0   cetirizine (ZYRTEC) 10 MG tablet, Take 1 tablet (10 mg total) by mouth daily., Disp: 30 tablet, Rfl: 11   fluticasone (FLONASE) 50 MCG/ACT nasal spray, Place 2 sprays into both nostrils daily., Disp: 16 g, Rfl: 6   ibuprofen (ADVIL) 400 MG tablet, Take 1  tablet (400 mg total) by mouth every 6 (six) hours as needed., Disp: 30 tablet, Rfl: 0  Observations/Objective: Patient is well-developed, well-nourished in no acute distress.  Resting comfortably  at home.  Head is normocephalic, atraumatic.  No labored breathing.  Speech is clear and coherent with logical content.  Patient is alert and oriented at baseline.    Assessment and Plan: 1. Ear infection  - ciprofloxacin-dexamethasone (CIPRODEX) OTIC suspension; Place 4 drops into the left ear 2 (two) times daily.  Dispense: 7.5 mL; Refill: 0  -given symptoms and inability to get to in person care- will start ear drops and see if she improves, she has strict in person precautions and is agreeable to these   Reviewed side effects, risks and benefits of medication.    Patient acknowledged agreement and understanding of the plan.   Past Medical, Surgical, Social History, Allergies, and Medications have been Reviewed.    Follow Up Instructions: I discussed the assessment and treatment plan with the patient. The patient was provided an opportunity to ask questions and all were answered. The patient agreed with the plan and demonstrated an understanding of the instructions.  A copy of instructions were sent to the patient via MyChart unless otherwise noted below.    The patient was advised to call back or seek an in-person evaluation if the symptoms worsen or if the condition fails to improve as anticipated.  Time:  I spent 15 minutes with the patient via telehealth technology discussing the above problems/concerns.    Freddy Finner, NP

## 2022-04-07 NOTE — Patient Instructions (Signed)
  Meghan Hughes, thank you for joining Freddy Finner, NP for today's virtual visit.  While this provider is not your primary care provider (PCP), if your PCP is located in our provider database this encounter information will be shared with them immediately following your visit.  Consent: (Patient) Meghan Hughes provided verbal consent for this virtual visit at the beginning of the encounter.  Current Medications:  Current Outpatient Medications:    ciprofloxacin-dexamethasone (CIPRODEX) OTIC suspension, Place 4 drops into the left ear 2 (two) times daily., Disp: 7.5 mL, Rfl: 0   amoxicillin-clavulanate (AUGMENTIN) 875-125 MG tablet, Take 1 tablet by mouth 2 (two) times daily., Disp: 14 tablet, Rfl: 0   bacitracin ointment, Apply 1 application topically 2 (two) times daily., Disp: 120 g, Rfl: 0   cetirizine (ZYRTEC) 10 MG tablet, Take 1 tablet (10 mg total) by mouth daily., Disp: 30 tablet, Rfl: 11   fluticasone (FLONASE) 50 MCG/ACT nasal spray, Place 2 sprays into both nostrils daily., Disp: 16 g, Rfl: 6   ibuprofen (ADVIL) 400 MG tablet, Take 1 tablet (400 mg total) by mouth every 6 (six) hours as needed., Disp: 30 tablet, Rfl: 0   Medications ordered in this encounter:  Meds ordered this encounter  Medications   ciprofloxacin-dexamethasone (CIPRODEX) OTIC suspension    Sig: Place 4 drops into the left ear 2 (two) times daily.    Dispense:  7.5 mL    Refill:  0    Order Specific Question:   Supervising Provider    Answer:   Hyacinth Meeker, BRIAN [[id number]]     *If you need refills on other medications prior to your next appointment, please contact your pharmacy*  Follow-Up: Call back or seek an in-person evaluation if the symptoms worsen or if the condition fails to improve as anticipated.   If you have been instructed to have an in-person evaluation today at a local Urgent Care facility, please use the link below. It will take you to a list of all of our available [facility],  including address, phone number and hours of operation. Please do not delay care.  [facility]  If you or a family member do not have a primary care provider, use the link below to schedule a visit and establish care. When you choose a [facility] primary care physician or advanced practice provider, you gain a long-term partner in health. Find a Primary Care Provider  Learn more about [facility]'s in-office and virtual care options: [facility] - Get Care Now

## 2022-04-09 ENCOUNTER — Encounter (HOSPITAL_COMMUNITY): Payer: Self-pay

## 2022-04-09 ENCOUNTER — Ambulatory Visit (HOSPITAL_COMMUNITY)
Admission: EM | Admit: 2022-04-09 | Discharge: 2022-04-09 | Disposition: A | Discharge status: Home / Self Care | Payer: Medicaid Other | Attending: Physician Assistant | Admitting: Physician Assistant

## 2022-04-09 DIAGNOSIS — H6982 Other specified disorders of Eustachian tube, left ear: Secondary | ICD-10-CM | POA: Diagnosis not present

## 2022-04-09 DIAGNOSIS — J069 Acute upper respiratory infection, unspecified: Secondary | ICD-10-CM

## 2022-04-09 DIAGNOSIS — J0110 Acute frontal sinusitis, unspecified: Secondary | ICD-10-CM

## 2022-04-09 DIAGNOSIS — H9202 Otalgia, left ear: Secondary | ICD-10-CM

## 2022-04-09 MED ORDER — METHYLPREDNISOLONE 4 MG PO TBPK: ORAL_TABLET | ORAL | 0 refills | Status: AC

## 2022-04-09 MED ORDER — IBUPROFEN 800 MG PO TABS: 800.0000 mg | ORAL_TABLET | Freq: Three times a day (TID) | ORAL | 0 refills | Status: AC

## 2022-04-09 NOTE — ED Triage Notes (Signed)
Pt reports left ear pain x1-2 weeks. She was prescribed antibiotic last week but these have not help.

## 2022-04-09 NOTE — Discharge Instructions (Addendum)
Advised to take the Medrol Dosepak per directions. Advised take ibuprofen 800 mg 1 every 8 hours with food to help reduce the ear and facial pain. Advised to follow-up PCP or return to urgent care if symptoms fail to improve

## 2022-04-09 NOTE — ED Provider Notes (Signed)
[facility]    CSN: [id number] Arrival date & time: 04/09/22  1332      History   Chief Complaint Chief Complaint  Patient presents with   Otalgia    HPI Meghan Hughes is a 41 y.o. female.   41 year old female presents with left ear and facial pain.  Patient relates in the past 10 days she has been having persistent left ear and facial pain, that has been worse over the past several days.  Patient indicates that she has had 2 virtual visits without improvement in her symptoms.  Patient indicates she was given Augmentin to treat a sinus infection 10 days ago, this did help however 2 days after completing the antibiotic her ear pain returned.  Patient indicates she did another virtual visit and she was given Ciprodex eardrops, patient relates after using this for 3 days it was not given any relief and she was still having pain.  Patient indicates today that she continues with left ear pain, facial pain around the jaw area.  Patient indicates the pain is worse with movement and with chewing.  Patient does continue to have some sinus congestion, bilateral ear congestion, she is using Flonase nasal spray on a regular basis to help improve her symptoms.  Patient indicates she is not having any fever or chills, and she has not having any rhinitis.  Patient is tolerating fluids well.   Otalgia   Past Medical History:  Diagnosis Date   Diabetes mellitus 2012   gestational/insulin    Patient Active Problem List   Diagnosis Date Noted   Gestational diabetes mellitus in pregnancy, insulin controlled 04/24/2011   Previous cesarean section 04/24/2011    Past Surgical History:  Procedure Laterality Date   CESAREAN SECTION  2011   CHOLECYSTECTOMY  2006   ECTOPIC PREGNANCY SURGERY  2009    OB History     Gravida  3   Para  2   Term  1   Preterm      AB  1   Living  2      SAB      IAB      Ectopic  1   Multiple      Live Births  2            Home  Medications    Prior to Admission medications   Medication Sig Start Date End Date Taking? Authorizing Provider  ibuprofen (ADVIL) 800 MG tablet Take 1 tablet (800 mg total) by mouth 3 (three) times daily. 04/09/22  Yes Ellsworth Lennox, PA-C  methylPREDNISolone (MEDROL DOSEPAK) 4 MG TBPK tablet Follow the directions for the Medrol Dost Pack 04/09/22  Yes Ellsworth Lennox, PA-C  bacitracin ointment Apply 1 application topically 2 (two) times daily. 03/05/20   Lamptey, Britta Mccreedy, MD  cetirizine (ZYRTEC) 10 MG tablet Take 1 tablet (10 mg total) by mouth daily. 03/07/21   Junie Spencer, FNP  ciprofloxacin-dexamethasone (CIPRODEX) OTIC suspension Place 4 drops into the left ear 2 (two) times daily. 04/07/22   Freddy Finner, NP  fluticasone (FLONASE) 50 MCG/ACT nasal spray Place 2 sprays into both nostrils daily. 03/07/21   Junie Spencer, FNP  cromolyn (NASALCROM) 5.2 MG/ACT nasal spray Place 1 spray into both nostrils 4 (four) times daily. Patient not taking: Reported on 09/17/2018 03/12/18 03/05/20  Georgetta Haber, NP  loratadine (CLARITIN) 10 MG tablet Take 1 tablet (10 mg total) by mouth daily. 09/17/18 03/05/20  Arnaldo Natal,  MD    Family History Family History  Problem Relation Age of Onset   Hypertension Mother    Alcoholism Father        and on drugs   Hypertension Maternal Grandmother    Ovarian cancer Maternal Grandmother    Heart disease Maternal Grandfather     Social History Social History   Tobacco Use   Smoking status: Every Day    Packs/day: 0.50    Years: 15.00    Total pack years: 7.50    Types: Cigarettes   Smokeless tobacco: Never  Substance Use Topics   Alcohol use: No   Drug use: Yes    Types: Marijuana    Comment: only 2 cigarettes per day/ before pregnant     Allergies   Patient has no known allergies.   Review of Systems Review of Systems  HENT:  Positive for ear pain (left ear).      Physical Exam Triage Vital Signs ED Triage Vitals  Enc Vitals  Group     BP 04/09/22 1433 122/86     Pulse Rate 04/09/22 1432 93     Resp 04/09/22 1432 18     Temp 04/09/22 1432 98.1 F (36.7 C)     Temp Source 04/09/22 1432 Oral     SpO2 04/09/22 1432 97 %     Weight --      Height --      Head Circumference --      Peak Flow --      Pain Score --      Pain Loc --      Pain Edu? --      Excl. in [place]? --    No data found.  Updated Vital Signs BP 122/86   Pulse 93   Temp 98.1 F (36.7 C) (Oral)   Resp 18   SpO2 97%   Visual Acuity Right Eye Distance:   Left Eye Distance:   Bilateral Distance:    Right Eye Near:   Left Eye Near:    Bilateral Near:     Physical Exam Constitutional:      Appearance: Normal appearance.  HENT:     Right Ear: Ear canal normal. Tympanic membrane is injected.     Left Ear: Ear canal normal. Tympanic membrane is injected.     Mouth/Throat:     Mouth: Mucous membranes are moist.     Pharynx: Oropharynx is clear. No pharyngeal swelling or posterior oropharyngeal erythema.  Cardiovascular:     Rate and Rhythm: Normal rate and regular rhythm.     Heart sounds: Normal heart sounds.  Pulmonary:     Effort: Pulmonary effort is normal.     Breath sounds: Normal breath sounds and air entry. No wheezing, rhonchi or rales.  Lymphadenopathy:     Cervical: No cervical adenopathy.  Neurological:     Mental Status: She is alert.      UC Treatments / Results  Labs (all labs ordered are listed, but only abnormal results are displayed) Labs Reviewed - No data to display  EKG   Radiology No results found.  Procedures Procedures (including critical care time)  Medications Ordered in UC Medications - No data to display  Initial Impression / Assessment and Plan / UC Course  I have reviewed the triage vital signs and the nursing notes.  Pertinent labs & imaging results that were available during my care of the patient were reviewed by me and considered in my medical decision  making (see chart for  details).    Plan: 1.  Patient advised take the Medrol Dosepak as directed, increase fluid intake. 2.  Patient advised take ibuprofen 800 mg 1 every 8 hours with food to help relieve pain for the left ear. 3.  Patient advised that this is not infectious and that she does not need an antibiotic or eardrops, this is more inflammatory in nature. 4.  Patient advised follow-up PCP or return to urgent care if symptoms fail to improve. Final Clinical Impressions(s) / UC Diagnoses   Final diagnoses:  Left ear pain  Viral upper respiratory tract infection  Acute non-recurrent frontal sinusitis  Eustachian tube dysfunction, left     Discharge Instructions      Advised to take the Medrol Dosepak per directions. Advised take ibuprofen 800 mg 1 every 8 hours with food to help reduce the ear and facial pain. Advised to follow-up PCP or return to urgent care if symptoms fail to improve    ED Prescriptions     Medication Sig Dispense Auth. Provider   ibuprofen (ADVIL) 800 MG tablet Take 1 tablet (800 mg total) by mouth 3 (three) times daily. 21 tablet Ellsworth Lennox, PA-C   methylPREDNISolone (MEDROL DOSEPAK) 4 MG TBPK tablet Follow the directions for the Medrol Dost Pack 1 each Ellsworth Lennox, PA-C      PDMP not reviewed this encounter.   Ellsworth Lennox, PA-C 04/09/22 [phone]

## 2022-11-29 ENCOUNTER — Telehealth: Payer: Medicaid Other | Admitting: Nurse Practitioner

## 2022-11-29 DIAGNOSIS — H1033 Unspecified acute conjunctivitis, bilateral: Secondary | ICD-10-CM

## 2022-11-29 MED ORDER — OFLOXACIN 0.3 % OP SOLN: 1.0000 [drp] | Freq: Four times a day (QID) | OPHTHALMIC | 0 refills | Status: AC

## 2022-11-29 NOTE — Progress Notes (Signed)
Virtual Visit Consent   Meghan Hughes, you are scheduled for a virtual visit with a [facility] provider today. Just as with appointments in the office, your consent must be obtained to participate. Your consent will be active for this visit and any virtual visit you may have with one of our providers in the next 365 days. If you have a MyChart account, a copy of this consent can be sent to you electronically.  As this is a virtual visit, video technology does not allow for your provider to perform a traditional examination. This may limit your provider's ability to fully assess your condition. If your provider identifies any concerns that need to be evaluated in person or the need to arrange testing (such as labs, EKG, etc.), we will make arrangements to do so. Although advances in technology are sophisticated, we cannot ensure that it will always work on either your end or our end. If the connection with a video visit is poor, the visit may have to be switched to a telephone visit. With either a video or telephone visit, we are not always able to ensure that we have a secure connection.  By engaging in this virtual visit, you consent to the provision of healthcare and authorize for your insurance to be billed (if applicable) for the services provided during this visit. Depending on your insurance coverage, you may receive a charge related to this service.  I need to obtain your verbal consent now. Are you willing to proceed with your visit today? Meghan Hughes has provided verbal consent on 11/29/2022 for a virtual visit (video or telephone). Apolonio Schneiders, FNP  Date: 11/29/2022 11:03 AM  Virtual Visit via Video Note   I, Apolonio Schneiders, connected with  Meghan Hughes  ([id number], 1980-11-12) on 11/29/22 at 11:15 AM EST by a video-enabled telemedicine application and verified that I am speaking with the correct person using two identifiers.  Location: Patient: Virtual Visit Location Patient:  Home Provider: Virtual Visit Location Provider: Home Office   I discussed the limitations of evaluation and management by telemedicine and the availability of in person appointments. The patient expressed understanding and agreed to proceed.    History of Present Illness: Meghan Hughes is a 42 y.o. who identifies as a female who was assigned female at birth, and is being seen today for pink eye.  She has had symptoms for the past 3-4 days   She woke up with her right eye crusted shut this am and now her left eye is bothering her as well   She was exposed to her niece that has pink eye   Her eyes are running and it is thick yellow "strings"  She has tried some over the counter eye drops without relief   Problems:  Patient Active Problem List   Diagnosis Date Noted   Gestational diabetes mellitus in pregnancy, insulin controlled 04/24/2011   Previous cesarean section 04/24/2011    Allergies: No Known Allergies Medications:  Current Outpatient Medications:    bacitracin ointment, Apply 1 application topically 2 (two) times daily., Disp: 120 g, Rfl: 0   cetirizine (ZYRTEC) 10 MG tablet, Take 1 tablet (10 mg total) by mouth daily., Disp: 30 tablet, Rfl: 11   ciprofloxacin-dexamethasone (CIPRODEX) OTIC suspension, Place 4 drops into the left ear 2 (two) times daily., Disp: 7.5 mL, Rfl: 0   fluticasone (FLONASE) 50 MCG/ACT nasal spray, Place 2 sprays into both nostrils daily., Disp: 16 g, Rfl: 6  ibuprofen (ADVIL) 800 MG tablet, Take 1 tablet (800 mg total) by mouth 3 (three) times daily., Disp: 21 tablet, Rfl: 0   methylPREDNISolone (MEDROL DOSEPAK) 4 MG TBPK tablet, Follow the directions for the Medrol Dost Pack, Disp: 1 each, Rfl: 0  Observations/Objective: Patient is well-developed, well-nourished in no acute distress.  Resting comfortably  at home.  Head is normocephalic, atraumatic.  No labored breathing.  Speech is clear and coherent with logical content.  Patient is alert and  oriented at baseline.  Right sclera erythematous conjunctiva injected    Assessment and Plan: 1. Acute bacterial conjunctivitis of both eyes  - ofloxacin (OCUFLOX) 0.3 % ophthalmic solution; Place 1 drop into both eyes 4 (four) times daily for 5 days.  Dispense: 5 mL; Refill: 0     Follow Up Instructions: I discussed the assessment and treatment plan with the patient. The patient was provided an opportunity to ask questions and all were answered. The patient agreed with the plan and demonstrated an understanding of the instructions.  A copy of instructions were sent to the patient via MyChart unless otherwise noted below.    The patient was advised to call back or seek an in-person evaluation if the symptoms worsen or if the condition fails to improve as anticipated.  Time:  I spent 10 minutes with the patient via telehealth technology discussing the above problems/concerns.    Apolonio Schneiders, FNP

## 2024-01-05 ENCOUNTER — Telehealth: Admitting: Family Medicine

## 2024-01-05 DIAGNOSIS — J019 Acute sinusitis, unspecified: Secondary | ICD-10-CM | POA: Diagnosis not present

## 2024-01-05 DIAGNOSIS — B9689 Other specified bacterial agents as the cause of diseases classified elsewhere: Secondary | ICD-10-CM

## 2024-01-05 MED ORDER — AMOXICILLIN-POT CLAVULANATE 875-125 MG PO TABS: 1.0000 | ORAL_TABLET | Freq: Two times a day (BID) | ORAL | 0 refills | Status: AC

## 2024-01-05 NOTE — Progress Notes (Signed)
 Virtual Visit Consent   [name], you are scheduled for a virtual visit with a [facility] provider today. Just as with appointments in the office, your consent must be obtained to participate. Your consent will be active for this visit and any virtual visit you may have with one of our providers in the next 365 days. If you have a MyChart account, a copy of this consent can be sent to you electronically.  As this is a virtual visit, video technology does not allow for your provider to perform a traditional examination. This may limit your provider's ability to fully assess your condition. If your provider identifies any concerns that need to be evaluated in person or the need to arrange testing (such as labs, EKG, etc.), we will make arrangements to do so. Although advances in technology are sophisticated, we cannot ensure that it will always work on either your end or our end. If the connection with a video visit is poor, the visit may have to be switched to a telephone visit. With either a video or telephone visit, we are not always able to ensure that we have a secure connection.  By engaging in this virtual visit, you consent to the provision of healthcare and authorize for your insurance to be billed (if applicable) for the services provided during this visit. Depending on your insurance coverage, you may receive a charge related to this service.  I need to obtain your verbal consent now. Are you willing to proceed with your visit today? Meghan Hughes has provided verbal consent on 01/05/2024 for a virtual visit (video or telephone). Georgana Curio, FNP  Date: 01/05/2024 4:12 PM   Virtual Visit via Video Note   I, Georgana Curio, connected with  Meghan Hughes  ([id number], 1980/12/22) on 01/05/24 at  4:15 PM EDT by a video-enabled telemedicine application and verified that I am speaking with the correct person using two identifiers.  Location: Patient: Virtual Visit Location Patient: Home Provider:  Virtual Visit Location Provider: Home Office   I discussed the limitations of evaluation and management by telemedicine and the availability of in person appointments. The patient expressed understanding and agreed to proceed.    History of Present Illness: Meghan Hughes is a 43 y.o. who identifies as a female who was assigned female at birth, and is being seen today for sinus pressure and pain with post nasal drainage that started with allergies and has worsened. Over the past 2 weeks. No fever, cough, wheezing or sob. Yellow green thick mucus. [name]  HPI: HPI  Problems:  Patient Active Problem List   Diagnosis Date Noted   Gestational diabetes mellitus in pregnancy, insulin controlled 04/24/2011   Previous cesarean section 04/24/2011    Allergies: No Known Allergies Medications:  Current Outpatient Medications:    bacitracin ointment, Apply 1 application topically 2 (two) times daily., Disp: 120 g, Rfl: 0   cetirizine (ZYRTEC) 10 MG tablet, Take 1 tablet (10 mg total) by mouth daily., Disp: 30 tablet, Rfl: 11   fluticasone (FLONASE) 50 MCG/ACT nasal spray, Place 2 sprays into both nostrils daily., Disp: 16 g, Rfl: 6   ibuprofen (ADVIL) 800 MG tablet, Take 1 tablet (800 mg total) by mouth 3 (three) times daily., Disp: 21 tablet, Rfl: 0   methylPREDNISolone (MEDROL DOSEPAK) 4 MG TBPK tablet, Follow the directions for the Medrol Dost Pack, Disp: 1 each, Rfl: 0  Observations/Objective: Patient is well-developed, well-nourished in no acute distress.  Resting comfortably  at home.  Head is normocephalic, atraumatic.  No labored breathing.  Speech is clear and coherent with logical content.  Patient is alert and oriented at baseline.    Assessment and Plan: 1. Acute bacterial sinusitis (Primary)  Increase fluids, humidifier at night, tylenol or ibuprofen as directed, UC if sx worsen. Take allegra and flonase.   Follow Up Instructions: I discussed the assessment and treatment plan with the  patient. The patient was provided an opportunity to ask questions and all were answered. The patient agreed with the plan and demonstrated an understanding of the instructions.  A copy of instructions were sent to the patient via MyChart unless otherwise noted below.     The patient was advised to call back or seek an in-person evaluation if the symptoms worsen or if the condition fails to improve as anticipated.    Georgana Curio, FNP

## 2024-01-05 NOTE — Patient Instructions (Signed)
